# Patient Record
Sex: Female | Born: 1975 | Race: Black or African American | Hispanic: No | Marital: Single | Smoking: Current every day smoker
Health system: Southern US, Community
[De-identification: ages and names within clinical notes are randomized; demographics above are authoritative.]

## PROBLEM LIST (undated history)

## (undated) DIAGNOSIS — K2270 Barrett's esophagus without dysplasia: Secondary | ICD-10-CM

## (undated) DIAGNOSIS — E7800 Pure hypercholesterolemia, unspecified: Secondary | ICD-10-CM

## (undated) DIAGNOSIS — F32A Depression, unspecified: Secondary | ICD-10-CM

## (undated) DIAGNOSIS — R42 Dizziness and giddiness: Secondary | ICD-10-CM

## (undated) DIAGNOSIS — F329 Major depressive disorder, single episode, unspecified: Secondary | ICD-10-CM

## (undated) DIAGNOSIS — C801 Malignant (primary) neoplasm, unspecified: Secondary | ICD-10-CM

## (undated) HISTORY — PX: ABDOMINAL HYSTERECTOMY: SHX81

---

## 1999-09-15 ENCOUNTER — Ambulatory Visit (HOSPITAL_COMMUNITY): Admission: RE | Admit: 1999-09-15 | Discharge: 1999-09-15 | Payer: Self-pay | Admitting: *Deleted

## 1999-09-15 ENCOUNTER — Encounter: Payer: Self-pay | Admitting: *Deleted

## 2010-10-11 DIAGNOSIS — C801 Malignant (primary) neoplasm, unspecified: Secondary | ICD-10-CM

## 2010-10-11 HISTORY — DX: Malignant (primary) neoplasm, unspecified: C80.1

## 2011-05-06 ENCOUNTER — Inpatient Hospital Stay (INDEPENDENT_AMBULATORY_CARE_PROVIDER_SITE_OTHER)
Admission: RE | Admit: 2011-05-06 | Discharge: 2011-05-06 | Disposition: A | Discharge status: Home / Self Care | Payer: Medicaid Other | Source: Ambulatory Visit | Attending: Family Medicine | Admitting: Family Medicine

## 2011-05-06 ENCOUNTER — Encounter: Payer: Self-pay | Admitting: Family Medicine

## 2011-05-06 DIAGNOSIS — L03119 Cellulitis of unspecified part of limb: Secondary | ICD-10-CM

## 2011-05-08 ENCOUNTER — Telehealth (INDEPENDENT_AMBULATORY_CARE_PROVIDER_SITE_OTHER): Payer: Self-pay

## 2011-09-13 NOTE — Telephone Encounter (Signed)
  Phone Note Outgoing Call   Call placed by: Linton Flemings RN,  May 08, 2011 11:42 AM Call placed to: Patient Summary of Call: follow up call to pt. states she is doing better and is still taking ABT

## 2011-09-13 NOTE — Progress Notes (Signed)
Summary: BOIL ON INSIDE OF RIGHT LEG (room procedure)   Vital Signs:  Patient Profile:   35 Years Old Female CC:      erythema/edema site on inner right thigh x 2 days Height:     63 inches Weight:      139 pounds O2 Sat:      97 % O2 treatment:    Room Air Temp:     98.2 degrees F oral Pulse rate:   79 / minute Resp:     16 per minute BP sitting:   107 / 73  (left arm) Cuff size:   regular  Pt. in pain?   yes    Location:   inner right thigh  Vitals Entered By: Lavell Islam RN (May 06, 2011 1:06 PM)                   Prior Medication List:  No prior medications documented  Updated Prior Medication List: No Medications Current Allergies: No known allergies History of Present Illness Chief Complaint: erythema/edema site on inner right thigh x 2 days History of Present Illness:  Subjective:  Patient complains of two day history of erythematous tender area on right upper inner thigh.  The area is gradually expeanding.  No drainage from the lesion.  She feels well otherwise.  No fevers, chills, and sweats.  Denies insect or tick bite.  REVIEW OF SYSTEMS Constitutional Symptoms      Denies fever, chills, night sweats, weight loss, weight gain, and fatigue.  Eyes       Denies change in vision, eye pain, eye discharge, glasses, contact lenses, and eye surgery. Ear/Nose/Throat/Mouth       Denies hearing loss/aids, change in hearing, ear pain, ear discharge, dizziness, frequent runny nose, frequent nose bleeds, sinus problems, sore throat, hoarseness, and tooth pain or bleeding.  Respiratory       Denies dry cough, productive cough, wheezing, shortness of breath, asthma, bronchitis, and emphysema/COPD.  Cardiovascular       Denies murmurs, chest pain, and tires easily with exhertion.    Gastrointestinal       Denies stomach pain, nausea/vomiting, diarrhea, constipation, blood in bowel movements, and indigestion. Genitourniary       Denies painful urination, kidney  stones, and loss of urinary control. Neurological       Denies paralysis, seizures, and fainting/blackouts. Musculoskeletal       Complains of redness and swelling.      Denies muscle pain, joint pain, joint stiffness, decreased range of motion, muscle weakness, and gout.  Skin       Complains of unusual moles/lumps or sores.      Denies bruising and hair/skin or nail changes.  Psych       Denies mood changes, temper/anger issues, anxiety/stress, speech problems, depression, and sleep problems. Other Comments: site on inner right thigh x 2 days; no known etiology   Past History:  Family History: Last updated: 05/06/2011 Family History Hypertension Family History of Stroke  Social History: Last updated: 05/06/2011 Current Smoker Alcohol use-no  Past Medical History: Unremarkable  Past Surgical History: Tubal ligation  Family History: Family History Hypertension Family History of Stroke  Social History: Current Smoker Alcohol use-no Smoking Status:  current   Objective:  Appearance:  Patient appears healthy, stated age, and in no acute distress  Skin:  Right upper inner thigh:  approximately 4cm dia erythematous, tender, indurated area.  Not fluctuant. Assessment New Problems: CELLULITIS, LEG, RIGHT (ICD-682.6)  Plan New Medications/Changes: DOXYCYCLINE HYCLATE 100 MG CAPS (DOXYCYCLINE HYCLATE) One by mouth two times a day  #20 x 0, 05/06/2011, Donna Christen MD  New Orders: New Patient Level III [phone] Planning Comments:   Begin doxycycline.  May take Ibuprofen 200mg , 4 tabs every 8 hours with food.  Begin warm soaks and/or application of heating pad several times daily. Return if develops increasing pain/swelling (may need I and D), or if worsens.   The patient and/or caregiver has been counseled thoroughly with regard to medications prescribed including dosage, schedule, interactions, rationale for use, and possible side effects and they verbalize  understanding.  Diagnoses and expected course of recovery discussed and will return if not improved as expected or if the condition worsens. Patient and/or caregiver verbalized understanding.  Prescriptions: DOXYCYCLINE HYCLATE 100 MG CAPS (DOXYCYCLINE HYCLATE) One by mouth two times a day  #20 x 0   Entered and Authorized by:   Donna Christen MD   Signed by:   Donna Christen MD on 05/06/2011   Method used:   Print then Give to Patient   RxID:   [id number]   Orders Added: 1)  New Patient Level III [57846]

## 2012-06-19 ENCOUNTER — Emergency Department
Admission: EM | Admit: 2012-06-19 | Discharge: 2012-06-19 | Discharge status: Absent without leave | Payer: Medicaid Other | Source: Home / Self Care

## 2013-08-12 ENCOUNTER — Emergency Department (HOSPITAL_BASED_OUTPATIENT_CLINIC_OR_DEPARTMENT_OTHER)
Admission: EM | Admit: 2013-08-12 | Discharge: 2013-08-12 | Discharge status: Against Medical Advice | Payer: Medicaid Other | Attending: Emergency Medicine | Admitting: Emergency Medicine

## 2013-08-12 ENCOUNTER — Encounter (HOSPITAL_BASED_OUTPATIENT_CLINIC_OR_DEPARTMENT_OTHER): Payer: Self-pay | Admitting: Emergency Medicine

## 2013-08-12 DIAGNOSIS — F17200 Nicotine dependence, unspecified, uncomplicated: Secondary | ICD-10-CM | POA: Insufficient documentation

## 2013-08-12 DIAGNOSIS — R51 Headache: Secondary | ICD-10-CM | POA: Insufficient documentation

## 2013-08-12 HISTORY — DX: Major depressive disorder, single episode, unspecified: F32.9

## 2013-08-12 HISTORY — DX: Depression, unspecified: F32.A

## 2013-08-12 NOTE — ED Notes (Signed)
Pt called RN to room and states she had a family emergency and needs to leave. Encourage pt to stay. Pt was next to be seen. States she is unable to wait.

## 2013-08-12 NOTE — ED Notes (Addendum)
Pt having headache, and thirst for four days.  No known fever.  Some nausea.  Some sensitivity to light and sound.  Pt recently seen at [facility] on Friday for abdominal pain/headache.

## 2014-03-06 ENCOUNTER — Emergency Department (HOSPITAL_COMMUNITY)
Admission: EM | Admit: 2014-03-06 | Discharge: 2014-03-06 | Disposition: A | Discharge status: Home / Self Care | Payer: Medicaid Other | Attending: Emergency Medicine | Admitting: Emergency Medicine

## 2014-03-06 ENCOUNTER — Encounter (HOSPITAL_COMMUNITY): Payer: Self-pay | Admitting: Emergency Medicine

## 2014-03-06 DIAGNOSIS — M79609 Pain in unspecified limb: Secondary | ICD-10-CM | POA: Insufficient documentation

## 2014-03-06 DIAGNOSIS — Z79899 Other long term (current) drug therapy: Secondary | ICD-10-CM | POA: Insufficient documentation

## 2014-03-06 DIAGNOSIS — F329 Major depressive disorder, single episode, unspecified: Secondary | ICD-10-CM | POA: Insufficient documentation

## 2014-03-06 DIAGNOSIS — F3289 Other specified depressive episodes: Secondary | ICD-10-CM | POA: Insufficient documentation

## 2014-03-06 DIAGNOSIS — F17200 Nicotine dependence, unspecified, uncomplicated: Secondary | ICD-10-CM | POA: Insufficient documentation

## 2014-03-06 DIAGNOSIS — M79606 Pain in leg, unspecified: Secondary | ICD-10-CM

## 2014-03-06 LAB — BASIC METABOLIC PANEL
BUN: 9 mg/dL (ref 6–23)
CO2: 24 mEq/L (ref 19–32)
Calcium: 8.9 mg/dL (ref 8.4–10.5)
Chloride: 108 mEq/L (ref 96–112)
Creatinine, Ser: 0.77 mg/dL (ref 0.50–1.10)
GFR calc Af Amer: >90 mL/min (ref >90)
GFR calc non Af Amer: >90 mL/min (ref >90)
Glucose, Bld: 94 mg/dL (ref 70–99)
Potassium: 4.1 mEq/L (ref 3.7–5.3)
Sodium: 143 mEq/L (ref 137–147)

## 2014-03-06 MED ORDER — IBUPROFEN 800 MG PO TABS
800.0000 mg | ORAL_TABLET | Freq: Once | ORAL | Status: AC
  Administered 2014-03-06: 800 mg via ORAL
  Filled 2014-03-06: qty 1

## 2014-03-06 NOTE — Progress Notes (Signed)
Left lower extremity venous duplex completed.  Left:  No evidence of DVT, superficial thrombosis, or Baker's cyst.  Right:  Negative for DVT in the common femoral vein.  

## 2014-03-06 NOTE — ED Notes (Addendum)
Lt. Posterior thigh and calf pain.  Described as a toothache. Began 3 days ago..denies any injuries +ROM NO redness warmth or swelling noted to the areas.  Walking decrease the pain .  Pt. Is concerned about blood clot.

## 2014-03-06 NOTE — Discharge Instructions (Signed)
Musculoskeletal Pain °Musculoskeletal pain is muscle and boney aches and pains. These pains can occur in any part of the body. Your caregiver may treat you without knowing the cause of the pain. They may treat you if blood or urine tests, X-rays, and other tests were normal.  °CAUSES °There is often not a definite cause or reason for these pains. These pains may be caused by a type of germ (virus). The discomfort may also come from overuse. Overuse includes working out too hard when your body is not fit. Boney aches also come from weather changes. Bone is sensitive to atmospheric pressure changes. °HOME CARE INSTRUCTIONS  °· Ask when your test results will be ready. Make sure you get your test results. °· Only take over-the-counter or prescription medicines for pain, discomfort, or fever as directed by your caregiver. If you were given medications for your condition, do not drive, operate machinery or power tools, or sign legal documents for 24 hours. Do not drink alcohol. Do not take sleeping pills or other medications that may interfere with treatment. °· Continue all activities unless the activities cause more pain. When the pain lessens, slowly resume normal activities. Gradually increase the intensity and duration of the activities or exercise. °· During periods of severe pain, bed rest may be helpful. Lay or sit in any position that is comfortable. °· Putting ice on the injured area. °· Put ice in a bag. °· Place a towel between your skin and the bag. °· Leave the ice on for 15 to 20 minutes, 3 to 4 times a day. °· Follow up with your caregiver for continued problems and no reason can be found for the pain. If the pain becomes worse or does not go away, it may be necessary to repeat tests or do additional testing. Your caregiver may need to look further for a possible cause. °SEEK IMMEDIATE MEDICAL CARE IF: °· You have pain that is getting worse and is not relieved by medications. °· You develop chest pain  that is associated with shortness or breath, sweating, feeling sick to your stomach (nauseous), or throw up (vomit). °· Your pain becomes localized to the abdomen. °· You develop any new symptoms that seem different or that concern you. °MAKE SURE YOU:  °· Understand these instructions. °· Will watch your condition. °· Will get help right away if you are not doing well or get worse. °Document Released: 09/27/2005 Document Revised: 12/20/2011 Document Reviewed: 06/01/2013 °[employer]® Patient Information ©2014 [employer]. ° °

## 2014-03-06 NOTE — ED Provider Notes (Signed)
CSN: [id number]     Arrival date & time 03/06/14  4193 History   First MD Initiated Contact with Patient 03/06/14 0800     Chief Complaint  Patient presents with  . Leg Pain     (Consider location/radiation/quality/duration/timing/severity/associated sxs/prior Treatment) Patient is a 38 y.o. female presenting with leg pain. The history is provided by the patient.  Leg Pain Location:  Leg Time since incident:  3 days Injury: no   Leg location:  L lower leg Pain details:    Quality:  Aching   Radiates to:  Does not radiate   Severity:  Moderate   Onset quality:  Gradual   Duration:  3 days   Timing:  Constant   Progression:  Worsening Chronicity:  New Dislocation: no   Relieved by:  Movement Exacerbated by: rest. Associated symptoms: no fever     Past Medical History  Diagnosis Date  . Depression    Past Surgical History  Procedure Laterality Date  . Abdominal hysterectomy     No family history on file. History  Substance Use Topics  . Smoking status: Current Every Day Smoker    Types: Cigarettes  . Smokeless tobacco: Not on file  . Alcohol Use: Yes     Comment: occasional   OB History   Grav Para Term Preterm Abortions TAB SAB Ect Mult Living                 Review of Systems  Constitutional: Negative for fever.  Respiratory: Negative for cough and shortness of breath.   Gastrointestinal: Negative for vomiting and abdominal pain.  All other systems reviewed and are negative.     Allergies  Review of patient's allergies indicates no known allergies.  Home Medications   Prior to Admission medications   Medication Sig Start Date End Date Taking? Authorizing Provider  citalopram (CELEXA) 20 MG tablet Take 20 mg by mouth daily.    Historical Provider, MD  estradiol-norethindrone [facility]) 0.05-0.14 MG/DAY Place 1 patch onto the skin 2 (two) times a week.    Historical Provider, MD   BP 117/80  Pulse 76  Temp(Src) 98.1 F (36.7 C) (Oral)  Resp  18  Ht 5\' 3"  (1.6 m)  Wt 153 lb (69.4 kg)  BMI 27.11 kg/m2  SpO2 99% Physical Exam  Nursing note and vitals reviewed. Constitutional: She is oriented to person, place, and time. She appears well-developed and well-nourished. No distress.  HENT:  Head: Normocephalic and atraumatic.  Eyes: EOM are normal. Pupils are equal, round, and reactive to light.  Neck: Normal range of motion. Neck supple.  Cardiovascular: Normal rate and regular rhythm.  Exam reveals no friction rub.   No murmur heard. Pulmonary/Chest: Effort normal and breath sounds normal. No respiratory distress. She has no wheezes. She has no rales.  Abdominal: Soft. She exhibits no distension. There is no tenderness. There is no rebound.  Musculoskeletal: Normal range of motion. She exhibits no edema.       Left knee: She exhibits normal range of motion.       Left ankle: Normal.       Left lower leg: Normal. She exhibits no tenderness, no swelling, no edema and no deformity.  Neurological: She is alert and oriented to person, place, and time.  Skin: She is not diaphoretic.    ED Course  Procedures (including critical care time) Labs Review Labs Reviewed  BASIC METABOLIC PANEL    Imaging Review No results found.   EKG  Interpretation None      Filed: 03/06/2014 9:20 AM Note Time: 03/06/2014 9:20 AM Status: Signed   Editor: Charlaine Dalton, RVT (Cardiovascular Sonographer)      Left lower extremity venous duplex completed. Left: No evidence of DVT, superficial thrombosis, or Baker's cyst. Right: Negative for DVT in the common femoral vein.         MDM   Final diagnoses:  Leg pain    48F here with L leg pain. Pain for 3 days, aching type pain. Worse with rest, better with movement. No injury. No fevers, SOB, N/V/D. Normal strength and sensation. Patient concerned about blood clot. Has risk factors of smoking, estrogen use, will [place] her leg. No swelling, normal pulses, normal ROM. DVT scan normal. Likely  musculoskeletal pain. Stable for discharge.  Osvaldo Shipper, MD 03/06/14 1020

## 2016-11-17 ENCOUNTER — Encounter (HOSPITAL_BASED_OUTPATIENT_CLINIC_OR_DEPARTMENT_OTHER): Payer: Self-pay

## 2016-11-17 ENCOUNTER — Emergency Department (HOSPITAL_BASED_OUTPATIENT_CLINIC_OR_DEPARTMENT_OTHER)
Admission: EM | Admit: 2016-11-17 | Discharge: 2016-11-17 | Disposition: A | Discharge status: Home / Self Care | Payer: BLUE CROSS/BLUE SHIELD | Attending: Emergency Medicine | Admitting: Emergency Medicine

## 2016-11-17 ENCOUNTER — Emergency Department (HOSPITAL_BASED_OUTPATIENT_CLINIC_OR_DEPARTMENT_OTHER): Payer: BLUE CROSS/BLUE SHIELD

## 2016-11-17 DIAGNOSIS — J111 Influenza due to unidentified influenza virus with other respiratory manifestations: Secondary | ICD-10-CM

## 2016-11-17 DIAGNOSIS — R197 Diarrhea, unspecified: Secondary | ICD-10-CM | POA: Insufficient documentation

## 2016-11-17 DIAGNOSIS — R05 Cough: Secondary | ICD-10-CM | POA: Diagnosis present

## 2016-11-17 DIAGNOSIS — R1110 Vomiting, unspecified: Secondary | ICD-10-CM | POA: Diagnosis not present

## 2016-11-17 DIAGNOSIS — R69 Illness, unspecified: Secondary | ICD-10-CM

## 2016-11-17 DIAGNOSIS — M791 Myalgia: Secondary | ICD-10-CM | POA: Insufficient documentation

## 2016-11-17 DIAGNOSIS — F17210 Nicotine dependence, cigarettes, uncomplicated: Secondary | ICD-10-CM | POA: Diagnosis not present

## 2016-11-17 MED ORDER — OSELTAMIVIR PHOSPHATE 75 MG PO CAPS: 75.0000 mg | ORAL_CAPSULE | Freq: Two times a day (BID) | ORAL | 0 refills | Status: DC

## 2016-11-17 MED ORDER — ALBUTEROL SULFATE HFA 108 (90 BASE) MCG/ACT IN AERS
4.0000 | INHALATION_SPRAY | Freq: Once | RESPIRATORY_TRACT | Status: AC
  Administered 2016-11-17: 4 via RESPIRATORY_TRACT
  Filled 2016-11-17: qty 6.7

## 2016-11-17 MED ORDER — ONDANSETRON 4 MG PO TBDP
4.0000 mg | ORAL_TABLET | Freq: Once | ORAL | Status: AC
  Administered 2016-11-17: 4 mg via ORAL
  Filled 2016-11-17: qty 1

## 2016-11-17 NOTE — ED Triage Notes (Addendum)
Cough and URI symptoms x2 days, diarrhea with mild N/V that started today.

## 2016-11-17 NOTE — Discharge Instructions (Signed)
You likely have the flu. This will take about 1-2 weeks to fully resolve.   Keep well hydrated and get rest.  Return for worsening symptoms, including persistent fever greater than 6-7 days, difficulty breathing, passing out, or any other symptoms concerning to you.

## 2016-11-17 NOTE — ED Provider Notes (Signed)
[place] DEPT [facility] Provider Note   CSN: [id number] Arrival date & time: 11/17/16  [id number]     History   Chief Complaint Chief Complaint  Patient presents with  . URI    HPI Jillian Kramer is a 41 y.o. female.  HPI 41 year old female who presents with cough and diarrhea. Onset of minimally productive cough 2 days ago with congestion. Had diarrhea and small episode of nausea and vomiting that started this morning. Initially felt a little lightheaded with this, but no syncope. Has tried over-the-counter cough medications without improvement in symptoms. No significant fevers or chills, abdominal pain, dysuria, urinary frequency. Has had soreness and body aches with this.   Past Medical History:  Diagnosis Date  . Depression     There are no active problems to display for this patient.   Past Surgical History:  Procedure Laterality Date  . ABDOMINAL HYSTERECTOMY      OB History    No data available       Home Medications    Prior to Admission medications   Medication Sig Start Date End Date Taking? Authorizing Provider  diclofenac (VOLTAREN) 75 MG EC tablet Take 75 mg by mouth 2 (two) times daily as needed (leg pain).    Historical Provider, MD  estradiol-norethindrone [facility]) 0.05-0.14 MG/DAY Place 1 patch onto the skin 2 (two) times a week. Wednesday & Saturday.    Historical Provider, MD    Family History No family history on file.  Social History Social History  Substance Use Topics  . Smoking status: Current Every Day Smoker    Packs/day: 0.50    Types: Cigarettes  . Smokeless tobacco: Never Used  . Alcohol use Yes     Comment: occasional     Allergies   Patient has no known allergies.   Review of Systems Review of Systems  Constitutional: Negative for fever.  HENT: Positive for congestion. Negative for sore throat.   Respiratory: Positive for cough.   Gastrointestinal: Positive for diarrhea and vomiting. Negative for abdominal pain.    Genitourinary: Negative for difficulty urinating.  Musculoskeletal: Positive for myalgias.  Allergic/Immunologic: Negative for immunocompromised state.  Hematological: Does not bruise/bleed easily.  All other systems reviewed and are negative.    Physical Exam Updated Vital Signs BP 127/92 (BP Location: Left Arm)   Pulse 103   Temp 98.4 F (36.9 C) (Oral)   Resp 18   Ht 5\' 3"  (1.6 m)   Wt 182 lb (82.6 kg)   SpO2 96%   BMI 32.24 kg/m   Physical Exam Physical Exam  Nursing note and vitals reviewed. Constitutional: Well developed, well nourished, non-toxic, and in no acute distress Head: Normocephalic and atraumatic.  Mouth/Throat: Oropharynx is clear and moist.  Neck: Normal range of motion. Neck supple.  Cardiovascular: Normal rate and regular rhythm.   Pulmonary/Chest: Effort normal and breath sounds normal.  scattered wheezing Abdominal: Soft. There is no tenderness. There is no rebound and no guarding.  Musculoskeletal: Normal range of motion.  Neurological: Alert, no facial droop, fluent speech, moves all extremities symmetrically Skin: Skin is warm and dry.  Psychiatric: Cooperative   ED Treatments / Results  Labs (all labs ordered are listed, but only abnormal results are displayed) Labs Reviewed - No data to display  EKG  EKG Interpretation None       Radiology Dg Chest 2 View  Result Date: 11/17/2016 CLINICAL DATA:  Cough. EXAM: CHEST  2 VIEW COMPARISON:  05/21/2015 FINDINGS: The heart size and  mediastinal contours are within normal limits. There is slight peribronchial thickening on the lateral view. Slight linear scarring in the left lung base laterally. The visualized skeletal structures are unremarkable. IMPRESSION: Slight bronchitic changes. Electronically Signed   By: Lorriane Shire M.D.   On: 11/17/2016 09:42    Procedures Procedures (including critical care time)  Medications Ordered in ED Medications  ondansetron (ZOFRAN-ODT) disintegrating  tablet 4 mg (4 mg Oral Given 11/17/16 0943)  albuterol (PROVENTIL HFA;VENTOLIN HFA) 108 (90 Base) MCG/ACT inhaler 4 puff (4 puffs Inhalation Given 11/17/16 0949)     Initial Impression / Assessment and Plan / ED Course  I have reviewed the triage vital signs and the nursing notes.  Pertinent labs & imaging results that were available during my care of the patient were reviewed by me and considered in my medical decision making (see chart for details).     41 year old female who presents with 2 days of cough, diarrhea. On presentation is well appearing and in no acute distress. Well hydrated on exam. Lungs with occasional wheeze, and no prior history of asthma or COPD. Current smoker. Given albuterol inhaler. CXR visualized and w/o pneumonia or infiltrate. On presentation consistent with viral illness. Discussed supportive care management for this. Discussed Tamiflu for potential flu illness. She has declined this medication at this time.   The patient appears reasonably screened and/or stabilized for discharge and I doubt any other medical condition or other [facility] requiring further screening, evaluation, or treatment in the ED at this time prior to discharge.  Strict return and follow-up instructions reviewed. She expressed understanding of all discharge instructions and felt comfortable with the plan of care.   Final Clinical Impressions(s) / ED Diagnoses   Final diagnoses:  Influenza-like illness    New Prescriptions Current Discharge Medication List       Forde Dandy, MD 11/17/16 [id number]

## 2016-11-23 ENCOUNTER — Emergency Department (HOSPITAL_BASED_OUTPATIENT_CLINIC_OR_DEPARTMENT_OTHER)
Admission: EM | Admit: 2016-11-23 | Discharge: 2016-11-23 | Disposition: A | Discharge status: Home / Self Care | Payer: BLUE CROSS/BLUE SHIELD | Attending: Emergency Medicine | Admitting: Emergency Medicine

## 2016-11-23 ENCOUNTER — Emergency Department (HOSPITAL_BASED_OUTPATIENT_CLINIC_OR_DEPARTMENT_OTHER): Payer: BLUE CROSS/BLUE SHIELD

## 2016-11-23 ENCOUNTER — Encounter (HOSPITAL_BASED_OUTPATIENT_CLINIC_OR_DEPARTMENT_OTHER): Payer: Self-pay | Admitting: *Deleted

## 2016-11-23 DIAGNOSIS — J40 Bronchitis, not specified as acute or chronic: Secondary | ICD-10-CM | POA: Diagnosis not present

## 2016-11-23 DIAGNOSIS — F17210 Nicotine dependence, cigarettes, uncomplicated: Secondary | ICD-10-CM | POA: Diagnosis not present

## 2016-11-23 DIAGNOSIS — R05 Cough: Secondary | ICD-10-CM | POA: Diagnosis present

## 2016-11-23 DIAGNOSIS — Z79899 Other long term (current) drug therapy: Secondary | ICD-10-CM | POA: Diagnosis not present

## 2016-11-23 MED ORDER — AZITHROMYCIN 250 MG PO TABS: 250.0000 mg | ORAL_TABLET | Freq: Every day | ORAL | 0 refills | Status: DC

## 2016-11-23 MED ORDER — BENZONATATE 100 MG PO CAPS: 100.0000 mg | ORAL_CAPSULE | Freq: Two times a day (BID) | ORAL | 0 refills | Status: DC | PRN

## 2016-11-23 MED ORDER — GUAIFENESIN-DM 100-10 MG/5ML PO SYRP: 5.0000 mL | ORAL_SOLUTION | ORAL | 0 refills | Status: DC | PRN

## 2016-11-23 NOTE — ED Triage Notes (Signed)
Continued cough. States she was seen for possible flu 5 days ago.

## 2016-11-23 NOTE — ED Provider Notes (Signed)
[place] DEPT [facility] Provider Note   CSN: [id number] Arrival date & time: 11/23/16  1341     History   Chief Complaint Chief Complaint  Patient presents with  . Cough    HPI Jillian Kramer is a 41 y.o. female.  HPI   Pt p/w 8 days of flu-like illness with has been improving but with persistent cough, today productive of bloody sputum.  Overall she has gotten much better and the chills, myalgias, diarrhea have resolved.  She feels that she is having trouble moving the congestion out of her chest.  Continues to have nasal discharge.  No recent fevers.  Denies SOB, CP, sore throat.    Denies recent immobilization, exogenous estrogen, leg swelling, history of blood clots.     Past Medical History:  Diagnosis Date  . Depression     There are no active problems to display for this patient.   Past Surgical History:  Procedure Laterality Date  . ABDOMINAL HYSTERECTOMY      OB History    No data available       Home Medications    Prior to Admission medications   Medication Sig Start Date End Date Taking? Authorizing Provider  azithromycin (ZITHROMAX) 250 MG tablet Take 1 tablet (250 mg total) by mouth daily. Take first 2 tablets together, then 1 every day until finished. 11/23/16   Clayton Bibles, PA-C  benzonatate (TESSALON) 100 MG capsule Take 1 capsule (100 mg total) by mouth 2 (two) times daily as needed for cough. 11/23/16   Clayton Bibles, PA-C  diclofenac (VOLTAREN) 75 MG EC tablet Take 75 mg by mouth 2 (two) times daily as needed (leg pain).    Historical Provider, MD  estradiol-norethindrone [facility]) 0.05-0.14 MG/DAY Place 1 patch onto the skin 2 (two) times a week. Wednesday & Saturday.    Historical Provider, MD  guaiFENesin-dextromethorphan (ROBITUSSIN DM) 100-10 MG/5ML syrup Take 5 mLs by mouth every 4 (four) hours as needed for cough. 11/23/16   Clayton Bibles, PA-C    Family History No family history on file.  Social History Social History  Substance Use  Topics  . Smoking status: Current Every Day Smoker    Packs/day: 0.50    Types: Cigarettes  . Smokeless tobacco: Never Used  . Alcohol use Yes     Comment: occasional     Allergies   Patient has no known allergies.   Review of Systems Review of Systems  All other systems reviewed and are negative.    Physical Exam Updated Vital Signs BP 133/88   Pulse 70   Temp 98 F (36.7 C) (Oral)   Resp 20   Ht 5\' 3"  (1.6 m)   Wt 82.6 kg   SpO2 100%   BMI 32.24 kg/m   Physical Exam  Constitutional: She appears well-developed and well-nourished. No distress.  HENT:  Head: Normocephalic and atraumatic.  Mouth/Throat: Uvula is midline, oropharynx is clear and moist and mucous membranes are normal. No oropharyngeal exudate, posterior oropharyngeal edema, posterior oropharyngeal erythema or tonsillar abscesses.  Eyes: Conjunctivae are normal.  Neck: Normal range of motion. Neck supple.  Cardiovascular: Normal rate and regular rhythm.   Pulmonary/Chest: Effort normal and breath sounds normal. No stridor. No respiratory distress. She has no wheezes. She has no rales.  Abdominal: Soft. She exhibits no distension. There is no tenderness.  Lymphadenopathy:    She has no cervical adenopathy.  Neurological: She is alert.  Skin: She is not diaphoretic.  Nursing note and vitals  reviewed.    ED Treatments / Results  Labs (all labs ordered are listed, but only abnormal results are displayed) Labs Reviewed - No data to display  EKG  EKG Interpretation None       Radiology Dg Chest 2 View  Result Date: 11/23/2016 CLINICAL DATA:  Cough and congestion with intermittent fever for 8 days. EXAM: CHEST  2 VIEW COMPARISON:  PA and lateral chest 11/17/2016. Single-view of the chest 05/21/2015. FINDINGS: Small linear focus of atelectasis or scar is seen in the lingula. Lungs are otherwise clear. Heart size is upper normal. No pneumothorax or pleural fluid. No acute bony abnormality.  IMPRESSION: No acute disease. Electronically Signed   By: Inge Rise M.D.   On: 11/23/2016 15:24    Procedures Procedures (including critical care time)  Medications Ordered in ED Medications - No data to display   Initial Impression / Assessment and Plan / ED Course  I have reviewed the triage vital signs and the nursing notes.  Pertinent labs & imaging results that were available during my care of the patient were reviewed by me and considered in my medical decision making (see chart for details).    Counseled on smoking cessation > 5 minutes.   Afebrile nontoxic patient with 8 days of influenza-like illness with resolving symptoms with exception of cough with hemoptysis today.  Reviewed CXR and discussed treatment with Dr Laverta Baltimore.  Pt has no PE risk factors.  D/C home with z-pak, cough medications, PCP follow up.  Discussed result, findings, treatment, and follow up  with patient.  Pt given return precautions.  Pt verbalizes understanding and agrees with plan.      Final Clinical Impressions(s) / ED Diagnoses   Final diagnoses:  Bronchitis    New Prescriptions Discharge Medication List as of 11/23/2016  4:20 PM    START taking these medications   Details  azithromycin (ZITHROMAX) 250 MG tablet Take 1 tablet (250 mg total) by mouth daily. Take first 2 tablets together, then 1 every day until finished., Starting Tue 11/23/2016, Print    benzonatate (TESSALON) 100 MG capsule Take 1 capsule (100 mg total) by mouth 2 (two) times daily as needed for cough., Starting Tue 11/23/2016, Print    guaiFENesin-dextromethorphan (ROBITUSSIN DM) 100-10 MG/5ML syrup Take 5 mLs by mouth every 4 (four) hours as needed for cough., Starting Tue 11/23/2016, Print         [place], PA-C 11/23/16 1635    Margette Fast, MD 11/24/16 1153

## 2016-11-23 NOTE — Discharge Instructions (Signed)
Read the information below.  Use the prescribed medication as directed.  Please discuss all new medications with your pharmacist.  You may return to the Emergency Department at any time for worsening condition or any new symptoms that concern you.    If you develop chest pain, shortness of breath, fever, you pass out, or become weak or dizzy, return to the ER for a recheck.    °

## 2017-08-02 ENCOUNTER — Emergency Department (HOSPITAL_BASED_OUTPATIENT_CLINIC_OR_DEPARTMENT_OTHER)
Admission: EM | Admit: 2017-08-02 | Discharge: 2017-08-02 | Disposition: A | Discharge status: Home / Self Care | Payer: BLUE CROSS/BLUE SHIELD | Attending: Emergency Medicine | Admitting: Emergency Medicine

## 2017-08-02 DIAGNOSIS — F17210 Nicotine dependence, cigarettes, uncomplicated: Secondary | ICD-10-CM | POA: Diagnosis not present

## 2017-08-02 DIAGNOSIS — K2100 Gastro-esophageal reflux disease with esophagitis, without bleeding: Secondary | ICD-10-CM

## 2017-08-02 DIAGNOSIS — R070 Pain in throat: Secondary | ICD-10-CM | POA: Diagnosis present

## 2017-08-02 DIAGNOSIS — R1010 Upper abdominal pain, unspecified: Secondary | ICD-10-CM | POA: Diagnosis not present

## 2017-08-02 LAB — CBC WITH DIFFERENTIAL/PLATELET
BASOS PCT: 0 %
Basophils Absolute: 0 10*3/uL (ref 0.0–0.1)
EOS ABS: 0.2 10*3/uL (ref 0.0–0.7)
EOS PCT: 3 %
HCT: 34.9 % — ABNORMAL LOW (ref 36.0–46.0)
HEMOGLOBIN: 12.3 g/dL (ref 12.0–15.0)
Lymphocytes Relative: 47 %
Lymphs Abs: 3.5 10*3/uL (ref 0.7–4.0)
MCH: 24.8 pg — AB (ref 26.0–34.0)
MCHC: 35.2 g/dL (ref 30.0–36.0)
MCV: 70.4 fL — AB (ref 78.0–100.0)
MONO ABS: 0.6 10*3/uL (ref 0.1–1.0)
Monocytes Relative: 8 %
NEUTROS ABS: 3.2 10*3/uL (ref 1.7–7.7)
Neutrophils Relative %: 42 %
PLATELETS: 275 10*3/uL (ref 150–400)
RBC: 4.96 MIL/uL (ref 3.87–5.11)
RDW: 14.9 % (ref 11.5–15.5)
WBC: 7.5 10*3/uL (ref 4.0–10.5)

## 2017-08-02 LAB — COMPREHENSIVE METABOLIC PANEL
ALT: 21 U/L (ref 14–54)
ANION GAP: 7 (ref 5–15)
AST: 18 U/L (ref 15–41)
Albumin: 3.8 g/dL (ref 3.5–5.0)
Alkaline Phosphatase: 67 U/L (ref 38–126)
BUN: 19 mg/dL (ref 6–20)
CALCIUM: 9.4 mg/dL (ref 8.9–10.3)
CO2: 24 mmol/L (ref 22–32)
CREATININE: 0.72 mg/dL (ref 0.44–1.00)
Chloride: 106 mmol/L (ref 101–111)
Glucose, Bld: 102 mg/dL — ABNORMAL HIGH (ref 65–99)
Potassium: 3.8 mmol/L (ref 3.5–5.1)
SODIUM: 137 mmol/L (ref 135–145)
Total Bilirubin: 0.4 mg/dL (ref 0.3–1.2)
Total Protein: 7.4 g/dL (ref 6.5–8.1)

## 2017-08-02 LAB — LIPASE, BLOOD: LIPASE: 25 U/L (ref 11–51)

## 2017-08-02 LAB — TROPONIN I

## 2017-08-02 MED ORDER — ALUM & MAG HYDROXIDE-SIMETH 400-400-40 MG/5ML PO SUSP: 15.0000 mL | Freq: Four times a day (QID) | ORAL | 0 refills | Status: DC | PRN

## 2017-08-02 MED ORDER — OMEPRAZOLE 20 MG PO CPDR: 20.0000 mg | DELAYED_RELEASE_CAPSULE | Freq: Two times a day (BID) | ORAL | 2 refills | Status: DC

## 2017-08-02 MED ORDER — GI COCKTAIL ~~LOC~~
30.0000 mL | Freq: Once | ORAL | Status: AC
  Administered 2017-08-02: 30 mL via ORAL
  Filled 2017-08-02: qty 30

## 2017-08-02 NOTE — ED Notes (Signed)
Pt. Reports the wings were really hot that she ate

## 2017-08-02 NOTE — Discharge Instructions (Signed)
Avoid alcohol, NSAIDs (ibuprofen, motrin, naprosyn), fatty or spicy foods Take medications as prescribed. One is acid inhibitor that may take a few weeks to kick in Return for worsening symptoms, including fever, intractable vomiting, escalating pain or any other symptoms concerning to you.

## 2017-08-02 NOTE — ED Triage Notes (Signed)
Reflux x 3 days. States she has been taking OTC reflux medication. Pain is worse when she swallows.

## 2017-08-02 NOTE — ED Provider Notes (Signed)
[facility] EMERGENCY DEPARTMENT Provider Note   CSN: [id number] Arrival date & time: 08/02/17  2007     History   Chief Complaint No chief complaint on file.   HPI Jillian Kramer is a 41 y.o. female.  HPI 41 year old female who presents with concern for reflux. State she ate hot wings 3 days ago, and since then with burning sensation in back of throat. Associated with upper abdominal bloating and pain. Try taking Pepto-Bismol and Tums without improvement. Denies any nausea, vomiting, diarrhea, melena or hematochezia, severe chest pain or difficulty breathing, syncope or near syncope, diaphoresis,melena or hematochezia. She has not had similar symptoms in the past.  Past Medical History:  Diagnosis Date  . Depression     There are no active problems to display for this patient.   Past Surgical History:  Procedure Laterality Date  . ABDOMINAL HYSTERECTOMY      OB History    No data available       Home Medications    Prior to Admission medications   Medication Sig Start Date End Date Taking? Authorizing Provider  alum & mag hydroxide-simeth (MYLANTA DOUBLE-STRENGTH) 400-400-40 MG/5ML suspension Take 15 mLs by mouth every 6 (six) hours as needed for indigestion (reflux). 08/02/17   Forde Dandy, MD  azithromycin (ZITHROMAX) 250 MG tablet Take 1 tablet (250 mg total) by mouth daily. Take first 2 tablets together, then 1 every day until finished. 11/23/16   Clayton Bibles, PA-C  benzonatate (TESSALON) 100 MG capsule Take 1 capsule (100 mg total) by mouth 2 (two) times daily as needed for cough. 11/23/16   Clayton Bibles, PA-C  diclofenac (VOLTAREN) 75 MG EC tablet Take 75 mg by mouth 2 (two) times daily as needed (leg pain).    [provider]  estradiol-norethindrone [facility]) 0.05-0.14 MG/DAY Place 1 patch onto the skin 2 (two) times a week. Wednesday & Saturday.    [provider]  guaiFENesin-dextromethorphan (ROBITUSSIN DM) 100-10 MG/5ML  syrup Take 5 mLs by mouth every 4 (four) hours as needed for cough. 11/23/16   Clayton Bibles, PA-C  omeprazole (PRILOSEC) 20 MG capsule Take 1 capsule (20 mg total) by mouth 2 (two) times daily before a meal. 08/02/17   Forde Dandy, MD    Family History No family history on file.  Social History Social History  Substance Use Topics  . Smoking status: Current Every Day Smoker    Packs/day: 0.50    Types: Cigarettes  . Smokeless tobacco: Never Used  . Alcohol use Yes     Comment: occasional     Allergies   Patient has no known allergies.   Review of Systems Review of Systems  Respiratory: Negative for shortness of breath.   Cardiovascular: Negative for chest pain.  Gastrointestinal: Positive for abdominal pain.  All other systems reviewed and are negative.    Physical Exam Updated Vital Signs BP 113/82 (BP Location: Right Arm)   Pulse 72   Temp 98.1 F (36.7 C) (Oral)   Resp 18   Ht 5\' 3"  (1.6 m)   Wt 77.1 kg (170 lb)   SpO2 97%   BMI 30.11 kg/m   Physical Exam Physical Exam  Nursing note and vitals reviewed. Constitutional: Well developed, well nourished, non-toxic, and in no acute distress Head: Normocephalic and atraumatic.  Mouth/Throat: Oropharynx is clear and moist.  Neck: Normal range of motion. Neck supple.  Cardiovascular: Normal rate and regular rhythm.   Pulmonary/Chest: Effort normal and breath  sounds normal.  Abdominal: Soft. There is Mild epigastric tenderness. There is no rebound and no guarding.  Musculoskeletal: Normal range of motion.  Neurological: Alert, no facial droop, fluent speech, moves all extremities symmetrically Skin: Skin is warm and dry.  Psychiatric: Cooperative   ED Treatments / Results  Labs (all labs ordered are listed, but only abnormal results are displayed) Labs Reviewed  CBC WITH DIFFERENTIAL/PLATELET - Abnormal; Notable for the following:       Result Value   HCT 34.9 (*)    MCV 70.4 (*)    MCH 24.8 (*)    All  other components within normal limits  COMPREHENSIVE METABOLIC PANEL - Abnormal; Notable for the following:    Glucose, Bld 102 (*)    All other components within normal limits  LIPASE, BLOOD  TROPONIN I    EKG  EKG Interpretation  Date/Time:  Tuesday August 02 2017 20:19:06 EDT Ventricular Rate:  73 PR Interval:  156 QRS Duration: 90 QT Interval:  368 QTC Calculation: 405 R Axis:   63 Text Interpretation:  Normal sinus rhythm Normal ECG No acute changes Confirmed by Brantley Stage [phone]) on 08/02/2017 10:27:00 PM       Radiology No results found.  Procedures Procedures (including critical care time)  Medications Ordered in ED Medications  gi cocktail (Maalox,Lidocaine,Donnatal) (30 mLs Oral Given 08/02/17 2146)     Initial Impression / Assessment and Plan / ED Course  I have reviewed the triage vital signs and the nursing notes.  Pertinent labs & imaging results that were available during my care of the patient were reviewed by me and considered in my medical decision making (see chart for details).     Presenting with 3 days of upper abdominal distention, pain, and burning in the back of her throat. Presentation seems consistent with reflux and indigestion. Her vital signs are stable. She is well-appearing. Abdomen is overall soft and benign with minimal epigastric tenderness. Symptoms not concerning for atypical ACS presentation.Blood work including CBC, comprehensive metabolic panel, lipase are unremarkable. Given GI cocktail with improved symptoms. Will discharge with PPI and Mylanta. Strict return and follow-up instructions reviewed. She expressed understanding of all discharge instructions and felt comfortable with the plan of care.   Final Clinical Impressions(s) / ED Diagnoses   Final diagnoses:  Gastroesophageal reflux disease with esophagitis    New Prescriptions New Prescriptions   ALUM & MAG HYDROXIDE-SIMETH (MYLANTA DOUBLE-STRENGTH) 400-400-40 MG/5ML  SUSPENSION    Take 15 mLs by mouth every 6 (six) hours as needed for indigestion (reflux).   OMEPRAZOLE (PRILOSEC) 20 MG CAPSULE    Take 1 capsule (20 mg total) by mouth 2 (two) times daily before a meal.     Forde Dandy, MD 08/02/17 2242

## 2017-11-04 ENCOUNTER — Encounter (HOSPITAL_BASED_OUTPATIENT_CLINIC_OR_DEPARTMENT_OTHER): Payer: Self-pay

## 2017-11-04 ENCOUNTER — Emergency Department (HOSPITAL_BASED_OUTPATIENT_CLINIC_OR_DEPARTMENT_OTHER)
Admission: EM | Admit: 2017-11-04 | Discharge: 2017-11-04 | Disposition: A | Discharge status: Home / Self Care | Payer: BLUE CROSS/BLUE SHIELD | Attending: Emergency Medicine | Admitting: Emergency Medicine

## 2017-11-04 ENCOUNTER — Other Ambulatory Visit: Payer: Self-pay

## 2017-11-04 DIAGNOSIS — F329 Major depressive disorder, single episode, unspecified: Secondary | ICD-10-CM | POA: Insufficient documentation

## 2017-11-04 DIAGNOSIS — Z8541 Personal history of malignant neoplasm of cervix uteri: Secondary | ICD-10-CM | POA: Insufficient documentation

## 2017-11-04 DIAGNOSIS — Z79899 Other long term (current) drug therapy: Secondary | ICD-10-CM | POA: Insufficient documentation

## 2017-11-04 DIAGNOSIS — F17210 Nicotine dependence, cigarettes, uncomplicated: Secondary | ICD-10-CM | POA: Insufficient documentation

## 2017-11-04 DIAGNOSIS — R1013 Epigastric pain: Secondary | ICD-10-CM | POA: Insufficient documentation

## 2017-11-04 HISTORY — DX: Malignant (primary) neoplasm, unspecified: C80.1

## 2017-11-04 LAB — URINALYSIS, ROUTINE W REFLEX MICROSCOPIC
BILIRUBIN URINE: NEGATIVE
Glucose, UA: NEGATIVE mg/dL
Ketones, ur: NEGATIVE mg/dL
Leukocytes, UA: NEGATIVE
Nitrite: NEGATIVE
PROTEIN: NEGATIVE mg/dL
Specific Gravity, Urine: 1.025 (ref 1.005–1.030)
pH: 6 (ref 5.0–8.0)

## 2017-11-04 LAB — CBC
HCT: 33.6 % — ABNORMAL LOW (ref 36.0–46.0)
Hemoglobin: 12.1 g/dL (ref 12.0–15.0)
MCH: 25.3 pg — AB (ref 26.0–34.0)
MCHC: 36 g/dL (ref 30.0–36.0)
MCV: 70.1 fL — ABNORMAL LOW (ref 78.0–100.0)
PLATELETS: 299 10*3/uL (ref 150–400)
RBC: 4.79 MIL/uL (ref 3.87–5.11)
RDW: 14.5 % (ref 11.5–15.5)
WBC: 7.8 10*3/uL (ref 4.0–10.5)

## 2017-11-04 LAB — COMPREHENSIVE METABOLIC PANEL
ALK PHOS: 68 U/L (ref 38–126)
ALT: 23 U/L (ref 14–54)
AST: 20 U/L (ref 15–41)
Albumin: 3.8 g/dL (ref 3.5–5.0)
Anion gap: 7 (ref 5–15)
BILIRUBIN TOTAL: 0.4 mg/dL (ref 0.3–1.2)
BUN: 12 mg/dL (ref 6–20)
CALCIUM: 9.1 mg/dL (ref 8.9–10.3)
CO2: 25 mmol/L (ref 22–32)
CREATININE: 0.86 mg/dL (ref 0.44–1.00)
Chloride: 107 mmol/L (ref 101–111)
Glucose, Bld: 95 mg/dL (ref 65–99)
Potassium: 3.6 mmol/L (ref 3.5–5.1)
SODIUM: 139 mmol/L (ref 135–145)
Total Protein: 7.9 g/dL (ref 6.5–8.1)

## 2017-11-04 LAB — URINALYSIS, MICROSCOPIC (REFLEX): WBC, UA: NONE SEEN WBC/hpf (ref 0–5)

## 2017-11-04 LAB — LIPASE, BLOOD: Lipase: 27 U/L (ref 11–51)

## 2017-11-04 LAB — PREGNANCY, URINE: PREG TEST UR: NEGATIVE

## 2017-11-04 MED ORDER — GI COCKTAIL ~~LOC~~
30.0000 mL | Freq: Once | ORAL | Status: AC
  Administered 2017-11-04: 30 mL via ORAL
  Filled 2017-11-04: qty 30

## 2017-11-04 MED ORDER — FAMOTIDINE 20 MG PO TABS: 20.0000 mg | ORAL_TABLET | Freq: Two times a day (BID) | ORAL | 0 refills | Status: AC

## 2017-11-04 NOTE — Discharge Instructions (Signed)
Please take Pepcid twice daily for the next week Follow up with your doctor Return if worsening

## 2017-11-04 NOTE — ED Triage Notes (Signed)
Pt c/o dull burning epigastric pain x 1 week. Pt also feels it in the back. Pt endorses associated nausea. Denies CP, ShOB.

## 2017-11-04 NOTE — ED Notes (Signed)
Attempted to call pt for bloodwork, pt went outside of ED

## 2017-11-04 NOTE — ED Provider Notes (Signed)
[facility] EMERGENCY DEPARTMENT Provider Note   CSN: [id number] Arrival date & time: 11/04/17  1440     History   Chief Complaint Chief Complaint  Patient presents with  . Abdominal Pain    HPI Jillian Kramer is a 42 y.o. female who presents with epigastric abdominal pain. PMH significant for GERD, hx of cervical cancer. She states that she's had epigastric pain for the past week. It comes and goes. It is a "nagging" pain. It felt similar to heartburn but also different. She has been taking Ibuprofen for MSK pain because she went to [facility] on 1/17 for nausea, chest pain, and left shoulder pain and testing came back normal. She thinks the Ibuprofen is aggravating her symptoms. Nothing has made her symptoms better. She has mild nausea as well. She has not tried anything OTC. She denies fever, chills, chest pain, SOB, vomiting, diarrhea. She was seen in the ED for heartburn in October as well.  HPI  Past Medical History:  Diagnosis Date  . Cancer ([place]) 2012   cervical stage 0  . Depression     There are no active problems to display for this patient.   Past Surgical History:  Procedure Laterality Date  . ABDOMINAL HYSTERECTOMY      OB History    No data available       Home Medications    Prior to Admission medications   Medication Sig Start Date End Date Taking? Authorizing Provider  alum & mag hydroxide-simeth (MYLANTA DOUBLE-STRENGTH) 400-400-40 MG/5ML suspension Take 15 mLs by mouth every 6 (six) hours as needed for indigestion (reflux). 08/02/17   Forde Dandy, MD  azithromycin (ZITHROMAX) 250 MG tablet Take 1 tablet (250 mg total) by mouth daily. Take first 2 tablets together, then 1 every day until finished. 11/23/16   Clayton Bibles, PA-C  benzonatate (TESSALON) 100 MG capsule Take 1 capsule (100 mg total) by mouth 2 (two) times daily as needed for cough. 11/23/16   Clayton Bibles, PA-C  diclofenac (VOLTAREN) 75 MG EC tablet Take 75 mg by mouth 2 (two)  times daily as needed (leg pain).    [provider]  estradiol-norethindrone [facility]) 0.05-0.14 MG/DAY Place 1 patch onto the skin 2 (two) times a week. Wednesday & Saturday.    [provider]  famotidine (PEPCID) 20 MG tablet Take 1 tablet (20 mg total) by mouth 2 (two) times daily. 11/04/17   Recardo Evangelist, PA-C  guaiFENesin-dextromethorphan (ROBITUSSIN DM) 100-10 MG/5ML syrup Take 5 mLs by mouth every 4 (four) hours as needed for cough. 11/23/16   Clayton Bibles, PA-C  omeprazole (PRILOSEC) 20 MG capsule Take 1 capsule (20 mg total) by mouth 2 (two) times daily before a meal. 08/02/17   Forde Dandy, MD    Family History No family history on file.  Social History Social History   Tobacco Use  . Smoking status: Current Every Day Smoker    Packs/day: 0.50    Types: Cigarettes  . Smokeless tobacco: Never Used  Substance Use Topics  . Alcohol use: Yes    Comment: occasional  . Drug use: No     Allergies   Patient has no known allergies.   Review of Systems Review of Systems   Physical Exam Updated Vital Signs BP (!) 134/92 (BP Location: Right Arm)   Pulse 77   Temp 98.4 F (36.9 C) (Oral)   Resp 16   Ht 5\' 3"  (1.6 m)   Wt 72.6 kg (160  lb)   SpO2 98%   BMI 28.34 kg/m   Physical Exam  Constitutional: She is oriented to person, place, and time. She appears well-developed and well-nourished. No distress.  Well-appearing  HENT:  Head: Normocephalic and atraumatic.  Eyes: Conjunctivae are normal. Pupils are equal, round, and reactive to light. Right eye exhibits no discharge. Left eye exhibits no discharge. No scleral icterus.  Neck: Normal range of motion.  Cardiovascular: Normal rate and regular rhythm. Exam reveals no gallop and no friction rub.  No murmur heard. Pulmonary/Chest: Effort normal and breath sounds normal. No stridor. No respiratory distress. She has no wheezes. She has no rales. She exhibits no tenderness.  Abdominal: Soft.  Bowel sounds are normal. She exhibits no distension. There is no tenderness.  Neurological: She is alert and oriented to person, place, and time.  Skin: Skin is warm and dry.  Psychiatric: She has a normal mood and affect. Her behavior is normal.  Nursing note and vitals reviewed.   ED Treatments / Results  Labs (all labs ordered are listed, but only abnormal results are displayed) Labs Reviewed  CBC - Abnormal; Notable for the following components:      Result Value   HCT 33.6 (*)    MCV 70.1 (*)    MCH 25.3 (*)    All other components within normal limits  URINALYSIS, ROUTINE W REFLEX MICROSCOPIC - Abnormal; Notable for the following components:   Hgb urine dipstick SMALL (*)    All other components within normal limits  URINALYSIS, MICROSCOPIC (REFLEX) - Abnormal; Notable for the following components:   Bacteria, UA RARE (*)    Squamous Epithelial / LPF 0-5 (*)    All other components within normal limits  LIPASE, BLOOD  COMPREHENSIVE METABOLIC PANEL  PREGNANCY, URINE    EKG  EKG Interpretation None       Radiology No results found.  Procedures Procedures (including critical care time)  Medications Ordered in ED Medications  gi cocktail (Maalox,Lidocaine,Donnatal) (30 mLs Oral Given 11/04/17 1817)     Initial Impression / Assessment and Plan / ED Course  I have reviewed the triage vital signs and the nursing notes.  Pertinent labs & imaging results that were available during my care of the patient were reviewed by me and considered in my medical decision making (see chart for details).  42 year old female presents with epigastric abdominal pain. Likely gastritis from taking Ibuprofen. Vitals are normal. Abdomen is soft and minimally tender. She is well appearing. Labs are normal. UA is clean. EKG is normal. She had a recent cardiac work up at [facility] and is referred to have a stress test done. She feels improved after GI cocktail. Rx for Pepcid given. Advised  f/u with PCP. Return precautions given.  Final Clinical Impressions(s) / ED Diagnoses   Final diagnoses:  Epigastric abdominal pain    ED Discharge Orders        Ordered    famotidine (PEPCID) 20 MG tablet  2 times daily     11/04/17 1835       Recardo Evangelist, PA-C 11/04/17 2036    Charlesetta Shanks, MD 11/08/17 1800

## 2017-11-05 ENCOUNTER — Other Ambulatory Visit: Payer: Self-pay

## 2017-11-05 ENCOUNTER — Encounter (HOSPITAL_BASED_OUTPATIENT_CLINIC_OR_DEPARTMENT_OTHER): Payer: Self-pay | Admitting: *Deleted

## 2017-11-05 ENCOUNTER — Emergency Department (HOSPITAL_BASED_OUTPATIENT_CLINIC_OR_DEPARTMENT_OTHER)
Admission: EM | Admit: 2017-11-05 | Discharge: 2017-11-06 | Disposition: A | Discharge status: Home / Self Care | Payer: BLUE CROSS/BLUE SHIELD | Attending: Emergency Medicine | Admitting: Emergency Medicine

## 2017-11-05 DIAGNOSIS — F17210 Nicotine dependence, cigarettes, uncomplicated: Secondary | ICD-10-CM | POA: Insufficient documentation

## 2017-11-05 DIAGNOSIS — Z8541 Personal history of malignant neoplasm of cervix uteri: Secondary | ICD-10-CM | POA: Insufficient documentation

## 2017-11-05 DIAGNOSIS — R110 Nausea: Secondary | ICD-10-CM | POA: Insufficient documentation

## 2017-11-05 DIAGNOSIS — R42 Dizziness and giddiness: Secondary | ICD-10-CM | POA: Insufficient documentation

## 2017-11-05 MED ORDER — MECLIZINE HCL 25 MG PO TABS: 25.0000 mg | ORAL_TABLET | Freq: Three times a day (TID) | ORAL | 0 refills | Status: DC | PRN

## 2017-11-05 NOTE — ED Triage Notes (Signed)
Pt reports recurrent dizzy spells over the last week. Tonight she was watching TV and when she got up she felt "spinny". She reports she was evaluated for similar symptoms 2 weeks ago at [facility]. SHe was here yesterday and had normal EKG and was dx with GERD. Pt states symptoms are slowing resolving at this time

## 2017-11-05 NOTE — ED Notes (Signed)
Pt describes episode of watching TV earlier tonight, turning her head had feeling like the room was spinning. Lasted about 20 minutes. Has been worked up 2 weeks ago for CP and yesterday for acid reflux. Pt also reports drinking a 24 oz beer after work everyday for 6 months up until about 3 weeks ago when she stopped "cold [place]". Thinks this may have "messed up her system". Denies dizziness at present. Neuro WNL.

## 2017-11-05 NOTE — ED Provider Notes (Signed)
[facility] EMERGENCY DEPARTMENT Provider Note   CSN: [id number] Arrival date & time: 11/05/17  1945     History   Chief Complaint Chief Complaint  Patient presents with  . Dizziness    HPI Rupinder Livingston is a 42 y.o. female.  HPI Patient is a 42 year old female presents the emergency department with acute onset dizziness with associated nausea.  She is never had this before.  It is worse when she turned right.  No weakness of her arms or legs.  No fevers or chills.  No recent head injury or trauma.  Denies symptoms similar before.  Feels much better at this time.  Resolution of her symptoms.  Her symptoms lasted approximately 20 minutes.   Past Medical History:  Diagnosis Date  . Cancer ([place]) 2012   cervical stage 0  . Depression     There are no active problems to display for this patient.   Past Surgical History:  Procedure Laterality Date  . ABDOMINAL HYSTERECTOMY      OB History    No data available       Home Medications    Prior to Admission medications   Medication Sig Start Date End Date Taking? Authorizing Provider  famotidine (PEPCID) 20 MG tablet Take 1 tablet (20 mg total) by mouth 2 (two) times daily. 11/04/17  Yes Recardo Evangelist, PA-C  meclizine (ANTIVERT) 25 MG tablet Take 1 tablet (25 mg total) by mouth 3 (three) times daily as needed for dizziness. 11/05/17   Jola Schmidt, MD    Family History No family history on file.  Social History Social History   Tobacco Use  . Smoking status: Current Every Day Smoker    Packs/day: 0.50    Types: Cigarettes  . Smokeless tobacco: Never Used  Substance Use Topics  . Alcohol use: No    Frequency: Never    Comment: occasional  . Drug use: No     Allergies   Patient has no known allergies.   Review of Systems Review of Systems  All other systems reviewed and are negative.    Physical Exam Updated Vital Signs BP 131/86 (BP Location: Right Arm)   Pulse 78   Temp 98.1  F (36.7 C) (Oral)   Resp 20   Ht 5\' 3"  (1.6 m)   Wt 72.6 kg (160 lb)   SpO2 99%   BMI 28.34 kg/m   Physical Exam  Constitutional: She is oriented to person, place, and time. She appears well-developed and well-nourished. No distress.  HENT:  Head: Normocephalic and atraumatic.  Eyes: EOM are normal. Pupils are equal, round, and reactive to light.  Neck: Normal range of motion.  Cardiovascular: Normal rate, regular rhythm and normal heart sounds.  Pulmonary/Chest: Effort normal and breath sounds normal.  Abdominal: Soft. She exhibits no distension. There is no tenderness.  Musculoskeletal: Normal range of motion.  Neurological: She is alert and oriented to person, place, and time.  5/5 strength in major muscle groups of  bilateral upper and lower extremities. Speech normal. No facial asymetry.   Skin: Skin is warm and dry.  Psychiatric: She has a normal mood and affect. Judgment normal.  Nursing note and vitals reviewed.    ED Treatments / Results  Labs (all labs ordered are listed, but only abnormal results are displayed) Labs Reviewed - No data to display  EKG  EKG Interpretation  Date/Time:  Saturday November 05 2017 20:00:37 EST Ventricular Rate:  80 PR Interval:  160  QRS Duration: 90 QT Interval:  362 QTC Calculation: 417 R Axis:   56 Text Interpretation:  Normal sinus rhythm Normal ECG No significant change since last tracing Confirmed by Orlie Dakin [phone]) on 11/05/2017 8:12:27 PM       Radiology No results found.  Procedures Procedures (including critical care time)  Medications Ordered in ED Medications - No data to display   Initial Impression / Assessment and Plan / ED Course  I have reviewed the triage vital signs and the nursing notes.  Pertinent labs & imaging results that were available during my care of the patient were reviewed by me and considered in my medical decision making (see chart for details).     Overall well-appearing.   Resolution of symptoms.  Discharged home in good condition.  Final Clinical Impressions(s) / ED Diagnoses   Final diagnoses:  Acute onset of severe vertigo    ED Discharge Orders        Ordered    meclizine (ANTIVERT) 25 MG tablet  3 times daily PRN     11/05/17 2346       Jola Schmidt, MD 11/05/17 2349

## 2017-11-05 NOTE — ED Notes (Signed)
ED Provider at bedside. 

## 2017-11-06 NOTE — ED Notes (Signed)
Pt given d/c instructions as per chart. Rx x 1. Verbalizes understanding. No questions. 

## 2018-01-23 ENCOUNTER — Emergency Department (HOSPITAL_BASED_OUTPATIENT_CLINIC_OR_DEPARTMENT_OTHER): Payer: BLUE CROSS/BLUE SHIELD

## 2018-01-23 ENCOUNTER — Encounter (HOSPITAL_BASED_OUTPATIENT_CLINIC_OR_DEPARTMENT_OTHER): Payer: Self-pay | Admitting: *Deleted

## 2018-01-23 ENCOUNTER — Emergency Department (HOSPITAL_BASED_OUTPATIENT_CLINIC_OR_DEPARTMENT_OTHER)
Admission: EM | Admit: 2018-01-23 | Discharge: 2018-01-23 | Disposition: A | Discharge status: Home / Self Care | Payer: BLUE CROSS/BLUE SHIELD | Attending: Emergency Medicine | Admitting: Emergency Medicine

## 2018-01-23 ENCOUNTER — Other Ambulatory Visit: Payer: Self-pay

## 2018-01-23 DIAGNOSIS — R42 Dizziness and giddiness: Secondary | ICD-10-CM

## 2018-01-23 DIAGNOSIS — Z79899 Other long term (current) drug therapy: Secondary | ICD-10-CM | POA: Diagnosis not present

## 2018-01-23 DIAGNOSIS — F17210 Nicotine dependence, cigarettes, uncomplicated: Secondary | ICD-10-CM | POA: Insufficient documentation

## 2018-01-23 DIAGNOSIS — R51 Headache: Secondary | ICD-10-CM | POA: Diagnosis not present

## 2018-01-23 DIAGNOSIS — H538 Other visual disturbances: Secondary | ICD-10-CM | POA: Insufficient documentation

## 2018-01-23 DIAGNOSIS — Z8541 Personal history of malignant neoplasm of cervix uteri: Secondary | ICD-10-CM | POA: Diagnosis not present

## 2018-01-23 DIAGNOSIS — R110 Nausea: Secondary | ICD-10-CM | POA: Diagnosis not present

## 2018-01-23 HISTORY — DX: Dizziness and giddiness: R42

## 2018-01-23 LAB — BASIC METABOLIC PANEL
ANION GAP: 9 (ref 5–15)
BUN: 16 mg/dL (ref 6–20)
CALCIUM: 9.4 mg/dL (ref 8.9–10.3)
CO2: 22 mmol/L (ref 22–32)
Chloride: 107 mmol/L (ref 101–111)
Creatinine, Ser: 0.76 mg/dL (ref 0.44–1.00)
Glucose, Bld: 97 mg/dL (ref 65–99)
Potassium: 3.8 mmol/L (ref 3.5–5.1)
Sodium: 138 mmol/L (ref 135–145)

## 2018-01-23 LAB — CBC WITH DIFFERENTIAL/PLATELET
BASOS ABS: 0 10*3/uL (ref 0.0–0.1)
Basophils Relative: 0 %
EOS ABS: 0.2 10*3/uL (ref 0.0–0.7)
Eosinophils Relative: 2 %
HCT: 34.9 % — ABNORMAL LOW (ref 36.0–46.0)
Hemoglobin: 12.8 g/dL (ref 12.0–15.0)
Lymphocytes Relative: 44 %
Lymphs Abs: 4 10*3/uL (ref 0.7–4.0)
MCH: 25.7 pg — ABNORMAL LOW (ref 26.0–34.0)
MCHC: 36.7 g/dL — AB (ref 30.0–36.0)
MCV: 70.1 fL — ABNORMAL LOW (ref 78.0–100.0)
Monocytes Absolute: 0.7 10*3/uL (ref 0.1–1.0)
Monocytes Relative: 8 %
NEUTROS PCT: 46 %
Neutro Abs: 4.1 10*3/uL (ref 1.7–7.7)
Platelets: 282 10*3/uL (ref 150–400)
RBC: 4.98 MIL/uL (ref 3.87–5.11)
RDW: 14.8 % (ref 11.5–15.5)
WBC: 9 10*3/uL (ref 4.0–10.5)

## 2018-01-23 LAB — PREGNANCY, URINE: PREG TEST UR: NEGATIVE

## 2018-01-23 MED ORDER — IOPAMIDOL (ISOVUE-370) INJECTION 76%
100.0000 mL | Freq: Once | INTRAVENOUS | Status: AC | PRN
  Administered 2018-01-23: 100 mL via INTRAVENOUS

## 2018-01-23 MED ORDER — SODIUM CHLORIDE 0.9 % IV BOLUS
500.0000 mL | Freq: Once | INTRAVENOUS | Status: AC
  Administered 2018-01-23: 500 mL via INTRAVENOUS

## 2018-01-23 MED ORDER — MECLIZINE HCL 25 MG PO TABS: 25.0000 mg | ORAL_TABLET | Freq: Three times a day (TID) | ORAL | 0 refills | Status: AC | PRN

## 2018-01-23 MED ORDER — METOCLOPRAMIDE HCL 5 MG/ML IJ SOLN
10.0000 mg | Freq: Once | INTRAMUSCULAR | Status: AC
  Administered 2018-01-23: 10 mg via INTRAVENOUS
  Filled 2018-01-23: qty 2

## 2018-01-23 MED ORDER — ONDANSETRON 4 MG PO TBDP: 4.0000 mg | ORAL_TABLET | Freq: Three times a day (TID) | ORAL | 0 refills | Status: AC | PRN

## 2018-01-23 NOTE — ED Provider Notes (Signed)
[place] EMERGENCY DEPARTMENT Provider Note   CSN: [id number] Arrival date & time: 01/23/18  1855     History   Chief Complaint Chief Complaint  Patient presents with  . Dizziness    HPI Jillian Kramer is a 42 y.o. female.  The history is provided by the patient. No language interpreter was used.  Dizziness   Jillian Kramer is a 42 y.o. female who presents to the Emergency Department complaining of dizziness. She presents to the emergency department for evaluation of dizziness and headache for the last two weeks. She was diagnosed in the emergency department a few months ago with vertigo. 1 to 2 weeks ago she noted that she was having pain in the back of her neck and behind bilateral eyes. She describes it is a constant throbbing headache. She also reports increasing episodic vertigo over the last few weeks. Episodes wax and wane and are worse with activity. She describes it as a off-balance sensation. She also endorses blurry vision. She has nausea but no vomiting. Denies fevers, numbness, weakness. She has been taking meclizine with no significant improvement in her symptoms. She is also tried Tylenol for her headache with no change. She states the symptoms now are different than her prior vertigo symptoms. Past Medical History:  Diagnosis Date  . Cancer ([place]) 2012   cervical stage 0  . Depression   . Vertigo     There are no active problems to display for this patient.   Past Surgical History:  Procedure Laterality Date  . ABDOMINAL HYSTERECTOMY       OB History   None      Home Medications    Prior to Admission medications   Medication Sig Start Date End Date Taking? Authorizing Provider  famotidine (PEPCID) 20 MG tablet Take 1 tablet (20 mg total) by mouth 2 (two) times daily. 11/04/17  Yes Recardo Evangelist, PA-C  meclizine (ANTIVERT) 25 MG tablet Take 1 tablet (25 mg total) by mouth 3 (three) times daily as needed for dizziness. 01/23/18    Quintella Reichert, MD  ondansetron (ZOFRAN ODT) 4 MG disintegrating tablet Take 1 tablet (4 mg total) by mouth every 8 (eight) hours as needed for nausea or vomiting. 01/23/18   Quintella Reichert, MD    Family History History reviewed. No pertinent family history.  Social History Social History   Tobacco Use  . Smoking status: Current Every Day Smoker    Packs/day: 0.50    Types: Cigarettes  . Smokeless tobacco: Never Used  Substance Use Topics  . Alcohol use: No    Frequency: Never    Comment: occasional  . Drug use: No     Allergies   Patient has no known allergies.   Review of Systems Review of Systems  Neurological: Positive for dizziness.  All other systems reviewed and are negative.    Physical Exam Updated Vital Signs BP 110/82   Pulse 78   Temp 98.4 F (36.9 C) (Oral)   Resp 15   Ht 5\' 3"  (1.6 m)   Wt 77.1 kg (170 lb)   SpO2 94%   BMI 30.11 kg/m   Physical Exam  Constitutional: She is oriented to person, place, and time. She appears well-developed and well-nourished.  HENT:  Head: Normocephalic and atraumatic.  Eyes: Pupils are equal, round, and reactive to light. EOM are normal.  Neck: Neck supple.  Cardiovascular: Normal rate and regular rhythm.  No murmur heard. Pulmonary/Chest: Effort normal and breath sounds normal.  No respiratory distress.  Abdominal: Soft. There is no tenderness. There is no rebound and no guarding.  Musculoskeletal: She exhibits no edema or tenderness.  Neurological: She is alert and oriented to person, place, and time. No cranial nerve deficit.  Five out of five strength in all four extremities with sensational light touch intact in all four extremities. No pronated or drift. No ataxia on finger to nose bilaterally.  Skin: Skin is warm and dry.  Psychiatric: She has a normal mood and affect. Her behavior is normal.  Nursing note and vitals reviewed.    ED Treatments / Results  Labs (all labs ordered are listed, but only  abnormal results are displayed) Labs Reviewed  CBC WITH DIFFERENTIAL/PLATELET - Abnormal; Notable for the following components:      Result Value   HCT 34.9 (*)    MCV 70.1 (*)    MCH 25.7 (*)    MCHC 36.7 (*)    All other components within normal limits  BASIC METABOLIC PANEL  PREGNANCY, URINE    EKG EKG Interpretation  Date/Time:  Monday January 23 2018 19:59:49 EDT Ventricular Rate:  86 PR Interval:    QRS Duration: 97 QT Interval:  362 QTC Calculation: 433 R Axis:   51 Text Interpretation:  Sinus rhythm  RSR' in V1 or V2, probably normal variant Confirmed by Quintella Reichert [phone]) on 01/23/2018 8:18:56 PM   Radiology Ct Angio Head W/cm &/or Wo Cm  Result Date: 01/23/2018 CLINICAL DATA:  Initial evaluation for acute dizziness for 1 month, blurry vision. EXAM: CT ANGIOGRAPHY HEAD AND NECK TECHNIQUE: Multidetector CT imaging of the head and neck was performed using the standard protocol during bolus administration of intravenous contrast. Multiplanar CT image reconstructions and MIPs were obtained to evaluate the vascular anatomy. Carotid stenosis measurements (when applicable) are obtained utilizing NASCET criteria, using the distal internal carotid diameter as the denominator. CONTRAST:  122mL ISOVUE-370 IOPAMIDOL (ISOVUE-370) INJECTION 76% COMPARISON:  None. FINDINGS: CT HEAD FINDINGS Brain: Cerebral volume within normal limits. No acute intracranial hemorrhage. No acute large vessel territory infarct. No mass lesion, midline shift or mass effect. No hydrocephalus. No extra-axial fluid collection. Vascular: No hyperdense vessel. Skull: Scalp soft tissues and calvarium within normal limits. Sinuses: Gl paranasal sinuses and mastoid air cells are clear. Orbits: Globes and orbital soft tissues within normal limits. Review of the MIP images confirms the above findings CTA NECK FINDINGS Aortic arch: Aortic arch of normal caliber with normal 3 vessel morphology. No flow-limiting stenosis  about the origin of the great vessels. Visualized subclavian arteries widely patent. Right carotid system: Right common and internal carotid arteries widely patent without stenosis, dissection, or occlusion. No atheromatous narrowing about the right carotid bifurcation. Left carotid system: Left common and internal carotid arteries are widely patent without stenosis, dissection, or occlusion. No atheromatous narrowing about the left carotid bifurcation. Vertebral arteries: Both of the vertebral arteries arise from the subclavian arteries. Left vertebral artery slightly dominant. Vertebral arteries widely patent within the neck without stenosis, dissection, or occlusion. Skeleton: No acute osseous abnormality. No worrisome lytic or blastic osseous lesions. Mild degenerate spondylolysis present at C4-5 through C6-7. Other neck: No acute soft tissue abnormality within the neck. Salivary glands normal. Thyroid normal. No adenopathy. Upper chest: Visualized upper chest demonstrates no acute abnormality. Partially visualized lungs are largely clear. Review of the MIP images confirms the above findings CTA HEAD FINDINGS Anterior circulation: Petrous, cavernous, and supraclinoid ICAs widely patent without flow-limiting stenosis. ICA termini widely patent.  Left A1 segment widely patent. Hypoplastic right A1 segment, which likely accounts for the diminutive right ICA is compared to the left. Normal anterior communicating artery. Anterior cerebral arteries widely patent to their distal aspects without stenosis. M1 segments widely patent bilaterally. No proximal M2 occlusion. Distal MCA branches well perfused and symmetric. Posterior circulation: Vertebral arteries widely patent to the vertebrobasilar junction. Posterior inferior cerebral arteries patent bilaterally. Basilar artery widely patent to its distal aspect without stenosis. Superior cerebral arteries patent bilaterally. Both of the posterior cerebral artery supplied  via the basilar and are well perfused to their distal aspects without stenosis. Venous sinuses: Patent. Anatomic variants: None significant. No aneurysm or vascular abnormality. Delayed phase: No abnormal enhancement. Review of the MIP images confirms the above findings IMPRESSION: Normal CTA of the head and neck. No acute intracranial abnormality identified. Electronically Signed   By: Jeannine Boga M.D.   On: 01/23/2018 21:52   Ct Angio Neck W And/or Wo Contrast  Result Date: 01/23/2018 CLINICAL DATA:  Initial evaluation for acute dizziness for 1 month, blurry vision. EXAM: CT ANGIOGRAPHY HEAD AND NECK TECHNIQUE: Multidetector CT imaging of the head and neck was performed using the standard protocol during bolus administration of intravenous contrast. Multiplanar CT image reconstructions and MIPs were obtained to evaluate the vascular anatomy. Carotid stenosis measurements (when applicable) are obtained utilizing NASCET criteria, using the distal internal carotid diameter as the denominator. CONTRAST:  186mL ISOVUE-370 IOPAMIDOL (ISOVUE-370) INJECTION 76% COMPARISON:  None. FINDINGS: CT HEAD FINDINGS Brain: Cerebral volume within normal limits. No acute intracranial hemorrhage. No acute large vessel territory infarct. No mass lesion, midline shift or mass effect. No hydrocephalus. No extra-axial fluid collection. Vascular: No hyperdense vessel. Skull: Scalp soft tissues and calvarium within normal limits. Sinuses: Gl paranasal sinuses and mastoid air cells are clear. Orbits: Globes and orbital soft tissues within normal limits. Review of the MIP images confirms the above findings CTA NECK FINDINGS Aortic arch: Aortic arch of normal caliber with normal 3 vessel morphology. No flow-limiting stenosis about the origin of the great vessels. Visualized subclavian arteries widely patent. Right carotid system: Right common and internal carotid arteries widely patent without stenosis, dissection, or occlusion.  No atheromatous narrowing about the right carotid bifurcation. Left carotid system: Left common and internal carotid arteries are widely patent without stenosis, dissection, or occlusion. No atheromatous narrowing about the left carotid bifurcation. Vertebral arteries: Both of the vertebral arteries arise from the subclavian arteries. Left vertebral artery slightly dominant. Vertebral arteries widely patent within the neck without stenosis, dissection, or occlusion. Skeleton: No acute osseous abnormality. No worrisome lytic or blastic osseous lesions. Mild degenerate spondylolysis present at C4-5 through C6-7. Other neck: No acute soft tissue abnormality within the neck. Salivary glands normal. Thyroid normal. No adenopathy. Upper chest: Visualized upper chest demonstrates no acute abnormality. Partially visualized lungs are largely clear. Review of the MIP images confirms the above findings CTA HEAD FINDINGS Anterior circulation: Petrous, cavernous, and supraclinoid ICAs widely patent without flow-limiting stenosis. ICA termini widely patent. Left A1 segment widely patent. Hypoplastic right A1 segment, which likely accounts for the diminutive right ICA is compared to the left. Normal anterior communicating artery. Anterior cerebral arteries widely patent to their distal aspects without stenosis. M1 segments widely patent bilaterally. No proximal M2 occlusion. Distal MCA branches well perfused and symmetric. Posterior circulation: Vertebral arteries widely patent to the vertebrobasilar junction. Posterior inferior cerebral arteries patent bilaterally. Basilar artery widely patent to its distal aspect without stenosis. Superior cerebral arteries  patent bilaterally. Both of the posterior cerebral artery supplied via the basilar and are well perfused to their distal aspects without stenosis. Venous sinuses: Patent. Anatomic variants: None significant. No aneurysm or vascular abnormality. Delayed phase: No abnormal  enhancement. Review of the MIP images confirms the above findings IMPRESSION: Normal CTA of the head and neck. No acute intracranial abnormality identified. Electronically Signed   By: Jeannine Boga M.D.   On: 01/23/2018 21:52    Procedures Procedures (including critical care time)  Medications Ordered in ED Medications  sodium chloride 0.9 % bolus 500 mL (0 mLs Intravenous Stopped 01/23/18 2116)  metoCLOPramide (REGLAN) injection 10 mg (10 mg Intravenous Given 01/23/18 2018)  iopamidol (ISOVUE-370) 76 % injection 100 mL (100 mLs Intravenous Contrast Given 01/23/18 2043)     Initial Impression / Assessment and Plan / ED Course  I have reviewed the triage vital signs and the nursing notes.  Pertinent labs & imaging results that were available during my care of the patient were reviewed by me and considered in my medical decision making (see chart for details).    patient here for evaluation of vertigo, progressive posterior and frontal headache. She is neurologically intact on evaluation with no focal deficits. The vertigo is not reproducible on evaluation in the emergency department. Presentation is not consistent with subarachnoid hemorrhage, meningitis, general sinus thrombosis. CTA obtained to evaluate for posterior circulation abnormality. CT is negative for acute finding. Following treatment in the department her symptoms are resolved. On repeat assessment she is feeling improved. Discussed with patient home care for headache, vertigo. Discussed outpatient follow-up as well as return precautions.  Final Clinical Impressions(s) / ED Diagnoses   Final diagnoses:  Vertigo    ED Discharge Orders        Ordered    meclizine (ANTIVERT) 25 MG tablet  3 times daily PRN     01/23/18 2211    ondansetron (ZOFRAN ODT) 4 MG disintegrating tablet  Every 8 hours PRN     01/23/18 2211       Quintella Reichert, MD 01/24/18 [phone]

## 2018-01-23 NOTE — ED Triage Notes (Signed)
Pt c/o dizziness x 1 month , blurred vision x 1 week

## 2018-03-31 ENCOUNTER — Emergency Department (HOSPITAL_BASED_OUTPATIENT_CLINIC_OR_DEPARTMENT_OTHER)
Admission: EM | Admit: 2018-03-31 | Discharge: 2018-03-31 | Disposition: A | Discharge status: Home / Self Care | Payer: BLUE CROSS/BLUE SHIELD | Attending: Emergency Medicine | Admitting: Emergency Medicine

## 2018-03-31 ENCOUNTER — Encounter (HOSPITAL_BASED_OUTPATIENT_CLINIC_OR_DEPARTMENT_OTHER): Payer: Self-pay

## 2018-03-31 ENCOUNTER — Emergency Department (HOSPITAL_BASED_OUTPATIENT_CLINIC_OR_DEPARTMENT_OTHER): Payer: BLUE CROSS/BLUE SHIELD

## 2018-03-31 ENCOUNTER — Other Ambulatory Visit: Payer: Self-pay

## 2018-03-31 DIAGNOSIS — R1011 Right upper quadrant pain: Secondary | ICD-10-CM | POA: Diagnosis not present

## 2018-03-31 DIAGNOSIS — R079 Chest pain, unspecified: Secondary | ICD-10-CM | POA: Insufficient documentation

## 2018-03-31 DIAGNOSIS — F17210 Nicotine dependence, cigarettes, uncomplicated: Secondary | ICD-10-CM | POA: Diagnosis not present

## 2018-03-31 DIAGNOSIS — R1013 Epigastric pain: Secondary | ICD-10-CM | POA: Diagnosis not present

## 2018-03-31 DIAGNOSIS — Z8541 Personal history of malignant neoplasm of cervix uteri: Secondary | ICD-10-CM | POA: Diagnosis not present

## 2018-03-31 DIAGNOSIS — R110 Nausea: Secondary | ICD-10-CM | POA: Diagnosis not present

## 2018-03-31 DIAGNOSIS — R197 Diarrhea, unspecified: Secondary | ICD-10-CM | POA: Diagnosis not present

## 2018-03-31 DIAGNOSIS — Z79899 Other long term (current) drug therapy: Secondary | ICD-10-CM | POA: Diagnosis not present

## 2018-03-31 LAB — URINALYSIS, ROUTINE W REFLEX MICROSCOPIC
Bilirubin Urine: NEGATIVE
Glucose, UA: NEGATIVE mg/dL
Ketones, ur: NEGATIVE mg/dL
LEUKOCYTES UA: NEGATIVE
Nitrite: NEGATIVE
PH: 6 (ref 5.0–8.0)
Protein, ur: NEGATIVE mg/dL
Specific Gravity, Urine: >1.03 — ABNORMAL HIGH (ref 1.005–1.030)

## 2018-03-31 LAB — CBC WITH DIFFERENTIAL/PLATELET
Basophils Absolute: 0 10*3/uL (ref 0.0–0.1)
Basophils Relative: 0 %
Eosinophils Absolute: 0.2 10*3/uL (ref 0.0–0.7)
Eosinophils Relative: 2 %
HEMATOCRIT: 34.5 % — AB (ref 36.0–46.0)
Hemoglobin: 12.3 g/dL (ref 12.0–15.0)
LYMPHS ABS: 3.4 10*3/uL (ref 0.7–4.0)
LYMPHS PCT: 37 %
MCH: 25.1 pg — ABNORMAL LOW (ref 26.0–34.0)
MCHC: 35.7 g/dL (ref 30.0–36.0)
MCV: 70.3 fL — AB (ref 78.0–100.0)
Monocytes Absolute: 0.6 10*3/uL (ref 0.1–1.0)
Monocytes Relative: 7 %
NEUTROS PCT: 54 %
Neutro Abs: 4.8 10*3/uL (ref 1.7–7.7)
Platelets: 279 10*3/uL (ref 150–400)
RBC: 4.91 MIL/uL (ref 3.87–5.11)
RDW: 14.7 % (ref 11.5–15.5)
WBC: 9 10*3/uL (ref 4.0–10.5)

## 2018-03-31 LAB — COMPREHENSIVE METABOLIC PANEL
ALT: 31 U/L (ref 14–54)
ANION GAP: 9 (ref 5–15)
AST: 18 U/L (ref 15–41)
Albumin: 3.9 g/dL (ref 3.5–5.0)
Alkaline Phosphatase: 63 U/L (ref 38–126)
BUN: 19 mg/dL (ref 6–20)
CHLORIDE: 110 mmol/L (ref 101–111)
CO2: 22 mmol/L (ref 22–32)
CREATININE: 0.85 mg/dL (ref 0.44–1.00)
Calcium: 9.1 mg/dL (ref 8.9–10.3)
GFR calc non Af Amer: >60 mL/min (ref >60)
Glucose, Bld: 91 mg/dL (ref 65–99)
Potassium: 3.6 mmol/L (ref 3.5–5.1)
SODIUM: 141 mmol/L (ref 135–145)
Total Bilirubin: 0.4 mg/dL (ref 0.3–1.2)
Total Protein: 7.7 g/dL (ref 6.5–8.1)

## 2018-03-31 LAB — URINALYSIS, MICROSCOPIC (REFLEX): WBC, UA: NONE SEEN WBC/hpf (ref 0–5)

## 2018-03-31 LAB — LIPASE, BLOOD: Lipase: 29 U/L (ref 11–51)

## 2018-03-31 MED ORDER — SODIUM CHLORIDE 0.9 % IV BOLUS
1000.0000 mL | Freq: Once | INTRAVENOUS | Status: AC
  Administered 2018-03-31: 1000 mL via INTRAVENOUS

## 2018-03-31 MED ORDER — PANTOPRAZOLE SODIUM 20 MG PO TBEC: 20.0000 mg | DELAYED_RELEASE_TABLET | Freq: Every day | ORAL | 1 refills | Status: AC

## 2018-03-31 MED ORDER — SUCRALFATE 1 GM/10ML PO SUSP: 1.0000 g | Freq: Three times a day (TID) | ORAL | 0 refills | Status: AC

## 2018-03-31 MED ORDER — PANTOPRAZOLE SODIUM 40 MG PO TBEC
40.0000 mg | DELAYED_RELEASE_TABLET | Freq: Once | ORAL | Status: AC
  Administered 2018-03-31: 40 mg via ORAL
  Filled 2018-03-31: qty 1

## 2018-03-31 NOTE — Discharge Instructions (Addendum)
Acid Reflux Treatment  Diet: Start with a clear liquid diet, progressed to a full liquid diet, and then bland solids as you are able. Please adhere to the enclosed dietary suggestions.  In general, avoid NSAIDs (i.e. ibuprofen, naproxen, etc.), caffeine, alcohol, spicy foods, fatty foods, or any other foods that seem to cause your symptoms to arise.  Protonix: Take this medication daily, 20-30 minutes prior to your first meal, for the next 8 weeks.  Continue to take this medication even if you begin to feel better.  Zantac: Continue taking this medication.  Carafate: Begin taking this medication, as prescribed.   Follow-up: Please follow-up with the gastroenterologist or your primary care provider on this matter.  Return: Return to the ED for significantly worsening symptoms, persistent vomiting, persistent fever, vomiting blood, blood in the stools, dark stools, or any other major concerns.

## 2018-03-31 NOTE — ED Notes (Signed)
ED Provider at bedside. 

## 2018-03-31 NOTE — ED Triage Notes (Signed)
C/o abd pain x 4 days-"little bit of diarrhea"-NAD-steady gait

## 2018-03-31 NOTE — ED Provider Notes (Signed)
[place] EMERGENCY DEPARTMENT Provider Note   CSN: [id number] Arrival date & time: 03/31/18  1705     History   Chief Complaint Chief Complaint  Patient presents with  . Abdominal Pain    HPI Jillian Kramer is a 42 y.o. female.  HPI   Jillian Kramer is a 42 y.o. female, with a history of cervical cancer (stage 0), presenting to the ED with epigastric pain intermittent for the last few days.  Pain begins approximately 5 to 10 minutes following a meal, typically with fatty meals.  Pain is "suddenly sharp, then feels like gnawing," 7/10 at worst, radiating to right upper quadrant.  Has not experienced this pain before.  Accompanied by nausea, burning into the chest, and occasional instance of loose stool.  Found some relief with Pepto-Bismol.  Denies fever/chills, vomiting, hematochezia/melena, urinary symptoms, or any other complaints.  Patient has an appointment scheduled with [facility] on June 25.  This appointment is also meant to serve as a 5-year follow-up on her colonoscopy.  Polyps were found on the colonoscopy 5 years ago and she was placed on a every 5-year colonoscopy schedule.   Past Medical History:  Diagnosis Date  . Cancer ([place]) 2012   cervical stage 0  . Depression   . Vertigo     There are no active problems to display for this patient.   Past Surgical History:  Procedure Laterality Date  . ABDOMINAL HYSTERECTOMY       OB History   None      Home Medications    Prior to Admission medications   Medication Sig Start Date End Date Taking? Authorizing Provider  famotidine (PEPCID) 20 MG tablet Take 1 tablet (20 mg total) by mouth 2 (two) times daily. 11/04/17   Recardo Evangelist, PA-C  meclizine (ANTIVERT) 25 MG tablet Take 1 tablet (25 mg total) by mouth 3 (three) times daily as needed for dizziness. 01/23/18   Quintella Reichert, MD  ondansetron (ZOFRAN ODT) 4 MG disintegrating tablet Take 1 tablet (4 mg total) by mouth every  8 (eight) hours as needed for nausea or vomiting. 01/23/18   Quintella Reichert, MD  pantoprazole (PROTONIX) 20 MG tablet Take 1 tablet (20 mg total) by mouth daily. 03/31/18 05/30/18  Evita Merida C, PA-C  sucralfate (CARAFATE) 1 GM/10ML suspension Take 10 mLs (1 g total) by mouth 4 (four) times daily -  with meals and at bedtime. 03/31/18   Jadin Kagel, Helane Gunther, PA-C    Family History No family history on file.  Social History Social History   Tobacco Use  . Smoking status: Current Every Day Smoker    Packs/day: 0.50    Types: Cigarettes  . Smokeless tobacco: Never Used  Substance Use Topics  . Alcohol use: Yes    Frequency: Never    Comment: occasional  . Drug use: No     Allergies   Patient has no known allergies.   Review of Systems Review of Systems  Constitutional: Negative for chills, diaphoresis and fever.  Respiratory: Negative for shortness of breath.   Cardiovascular: Negative for chest pain.  Gastrointestinal: Positive for abdominal pain, diarrhea and nausea. Negative for blood in stool and vomiting.  Genitourinary: Negative for dysuria, frequency and hematuria.  All other systems reviewed and are negative.    Physical Exam Updated Vital Signs BP 115/78 (BP Location: Left Arm)   Pulse 98   Temp 98.3 F (36.8 C) (Oral)   Resp 20   Ht  5\' 3"  (1.6 m)   Wt 82.1 kg (181 lb)   SpO2 98%   BMI 32.06 kg/m   Physical Exam  Constitutional: She appears well-developed and well-nourished. No distress.  HENT:  Head: Normocephalic and atraumatic.  Eyes: Conjunctivae are normal.  Neck: Neck supple.  Cardiovascular: Normal rate, regular rhythm, normal heart sounds and intact distal pulses.  Pulmonary/Chest: Effort normal and breath sounds normal. No respiratory distress.  Abdominal: Soft. Bowel sounds are normal. There is no tenderness. There is no guarding.  No tenderness on exam.  Musculoskeletal: She exhibits no edema.  Lymphadenopathy:    She has no cervical adenopathy.    Neurological: She is alert.  Skin: Skin is warm and dry. She is not diaphoretic.  Psychiatric: She has a normal mood and affect. Her behavior is normal.  Nursing note and vitals reviewed.    ED Treatments / Results  Labs (all labs ordered are listed, but only abnormal results are displayed) Labs Reviewed  CBC WITH DIFFERENTIAL/PLATELET - Abnormal; Notable for the following components:      Result Value   HCT 34.5 (*)    MCV 70.3 (*)    MCH 25.1 (*)    All other components within normal limits  URINALYSIS, ROUTINE W REFLEX MICROSCOPIC - Abnormal; Notable for the following components:   Specific Gravity, Urine >1.030 (*)    Hgb urine dipstick TRACE (*)    All other components within normal limits  URINALYSIS, MICROSCOPIC (REFLEX) - Abnormal; Notable for the following components:   Bacteria, UA RARE (*)    All other components within normal limits  COMPREHENSIVE METABOLIC PANEL  LIPASE, BLOOD    EKG None  Radiology US Abdomen Limited Ruq  Result Date: 03/31/2018 CLINICAL DATA:  Postprandial epigastric pain and nausea for 4 days. EXAM: ULTRASOUND ABDOMEN LIMITED RIGHT UPPER QUADRANT COMPARISON:  CT, 08/10/2013 FINDINGS: Gallbladder: No gallstones or wall thickening visualized. No sonographic Murphy sign noted by sonographer. Common bile duct: Diameter: 2 mm Liver: No focal lesion identified. Within normal limits in parenchymal echogenicity. Portal vein is patent on color Doppler imaging with normal direction of blood flow towards the liver. Incidental note made of a 4.3 cm lower pole right renal cyst. IMPRESSION: 1. No acute findings. Normal gallbladder with no bile duct dilation. Electronically Signed   By: Lajean Manes M.D.   On: 03/31/2018 18:50    Procedures Procedures (including critical care time)  Medications Ordered in ED Medications  sodium chloride 0.9 % bolus 1,000 mL (1,000 mLs Intravenous New Bag/Given 03/31/18 1753)  pantoprazole (PROTONIX) EC tablet 40 mg (40  mg Oral Given 03/31/18 1900)     Initial Impression / Assessment and Plan / ED Course  I have reviewed the triage vital signs and the nursing notes.  Pertinent labs & imaging results that were available during my care of the patient were reviewed by me and considered in my medical decision making (see chart for details).     Patient presents with epigastric pain after eating.  Presentation suspicious for peptic ulcer disease versus biliary colic. Patient is nontoxic appearing, afebrile, not tachycardic, not tachypneic, not hypotensive, maintains SPO2 of 98% on room air, and is in no apparent distress.  Patient is already taking Zantac daily.  We will add in Protonix and Carafate.  She has appropriate follow-up already scheduled. The patient was given instructions for home care as well as return precautions. Patient voices understanding of these instructions, accepts the plan, and is comfortable with discharge.  Vitals:   03/31/18 1710 03/31/18 1917  BP: 115/78 114/74  Pulse: 98 76  Resp: 20 16  Temp: 98.3 F (36.8 C)   TempSrc: Oral   SpO2: 98% 98%  Weight: 82.1 kg (181 lb)   Height: 5\' 3"  (1.6 m)      Final Clinical Impressions(s) / ED Diagnoses   Final diagnoses:  RUQ pain  Epigastric pain    ED Discharge Orders        Ordered    pantoprazole (PROTONIX) 20 MG tablet  Daily     03/31/18 1901    sucralfate (CARAFATE) 1 GM/10ML suspension  3 times daily with meals & bedtime     03/31/18 1901       Layla Maw 03/31/18 1923    Drenda Freeze, MD 03/31/18 [phone]

## 2018-04-01 ENCOUNTER — Other Ambulatory Visit: Payer: Self-pay

## 2018-04-01 ENCOUNTER — Emergency Department (HOSPITAL_BASED_OUTPATIENT_CLINIC_OR_DEPARTMENT_OTHER)
Admission: EM | Admit: 2018-04-01 | Discharge: 2018-04-01 | Disposition: A | Discharge status: Home / Self Care | Payer: BLUE CROSS/BLUE SHIELD | Attending: Emergency Medicine | Admitting: Emergency Medicine

## 2018-04-01 ENCOUNTER — Emergency Department (HOSPITAL_BASED_OUTPATIENT_CLINIC_OR_DEPARTMENT_OTHER): Payer: BLUE CROSS/BLUE SHIELD

## 2018-04-01 ENCOUNTER — Encounter (HOSPITAL_BASED_OUTPATIENT_CLINIC_OR_DEPARTMENT_OTHER): Payer: Self-pay | Admitting: Emergency Medicine

## 2018-04-01 DIAGNOSIS — Z79899 Other long term (current) drug therapy: Secondary | ICD-10-CM | POA: Insufficient documentation

## 2018-04-01 DIAGNOSIS — F17210 Nicotine dependence, cigarettes, uncomplicated: Secondary | ICD-10-CM | POA: Insufficient documentation

## 2018-04-01 DIAGNOSIS — R0789 Other chest pain: Secondary | ICD-10-CM | POA: Insufficient documentation

## 2018-04-01 DIAGNOSIS — M25512 Pain in left shoulder: Secondary | ICD-10-CM | POA: Diagnosis not present

## 2018-04-01 DIAGNOSIS — R079 Chest pain, unspecified: Secondary | ICD-10-CM

## 2018-04-01 DIAGNOSIS — Z8541 Personal history of malignant neoplasm of cervix uteri: Secondary | ICD-10-CM | POA: Diagnosis not present

## 2018-04-01 DIAGNOSIS — R42 Dizziness and giddiness: Secondary | ICD-10-CM | POA: Insufficient documentation

## 2018-04-01 LAB — CBC
HCT: 34.3 % — ABNORMAL LOW (ref 36.0–46.0)
Hemoglobin: 12.2 g/dL (ref 12.0–15.0)
MCH: 25 pg — AB (ref 26.0–34.0)
MCHC: 35.6 g/dL (ref 30.0–36.0)
MCV: 70.3 fL — ABNORMAL LOW (ref 78.0–100.0)
PLATELETS: 269 10*3/uL (ref 150–400)
RBC: 4.88 MIL/uL (ref 3.87–5.11)
RDW: 14.8 % (ref 11.5–15.5)
WBC: 6 10*3/uL (ref 4.0–10.5)

## 2018-04-01 LAB — BASIC METABOLIC PANEL
Anion gap: 7 (ref 5–15)
BUN: 13 mg/dL (ref 6–20)
CALCIUM: 8.6 mg/dL — AB (ref 8.9–10.3)
CO2: 22 mmol/L (ref 22–32)
Chloride: 110 mmol/L (ref 101–111)
Creatinine, Ser: 0.73 mg/dL (ref 0.44–1.00)
GFR calc Af Amer: >60 mL/min (ref >60)
GLUCOSE: 102 mg/dL — AB (ref 65–99)
POTASSIUM: 3.7 mmol/L (ref 3.5–5.1)
Sodium: 139 mmol/L (ref 135–145)

## 2018-04-01 LAB — TROPONIN I

## 2018-04-01 NOTE — ED Triage Notes (Signed)
Central chest pain radiating to her L arm since yesterday. Pt seen here yesterday for abd pain.

## 2018-04-01 NOTE — ED Provider Notes (Signed)
[facility] EMERGENCY DEPARTMENT Provider Note   CSN: [id number] Arrival date & time: 04/01/18  0908     History   Chief Complaint Chief Complaint  Patient presents with  . Chest Pain    HPI Earlie Grose is a 42 y.o. female.  HPI 42 year old female presents with chest pain.  Started yesterday.  She was seen here for epigastric abdominal pain which feels like reflux yesterday.  That pain is been going on for about 4 days.  However this pain is been coming and going since yesterday.  It feels like a heaviness in her chest that lasts about 1 minute and then dissipates.  Has been coming and going.  Today she felt slightly lightheaded for a couple seconds when this occurred.  No shortness of breath, nausea, vomiting.  There is no current abdominal pain.  She thinks it might be coming from her left shoulder which has been giving her a problem with pain and discomfort for quite some time.  Nothing she does makes the pain better or worse.  There is no current pain.  There is no pleuritic pain.  No recent leg swelling or travel.  No exertional symptoms.  Past Medical History:  Diagnosis Date  . Cancer ([place]) 2012   cervical stage 0  . Depression   . Vertigo     There are no active problems to display for this patient.   Past Surgical History:  Procedure Laterality Date  . ABDOMINAL HYSTERECTOMY       OB History   None      Home Medications    Prior to Admission medications   Medication Sig Start Date End Date Taking? Authorizing Provider  famotidine (PEPCID) 20 MG tablet Take 1 tablet (20 mg total) by mouth 2 (two) times daily. 11/04/17   Recardo Evangelist, PA-C  meclizine (ANTIVERT) 25 MG tablet Take 1 tablet (25 mg total) by mouth 3 (three) times daily as needed for dizziness. 01/23/18   Quintella Reichert, MD  ondansetron (ZOFRAN ODT) 4 MG disintegrating tablet Take 1 tablet (4 mg total) by mouth every 8 (eight) hours as needed for nausea or vomiting. 01/23/18    Quintella Reichert, MD  pantoprazole (PROTONIX) 20 MG tablet Take 1 tablet (20 mg total) by mouth daily. 03/31/18 05/30/18  Joy, Shawn C, PA-C  sucralfate (CARAFATE) 1 GM/10ML suspension Take 10 mLs (1 g total) by mouth 4 (four) times daily -  with meals and at bedtime. 03/31/18   Joy, Helane Gunther, PA-C    Family History No family history on file.  Social History Social History   Tobacco Use  . Smoking status: Current Every Day Smoker    Packs/day: 0.50    Types: Cigarettes  . Smokeless tobacco: Never Used  Substance Use Topics  . Alcohol use: Yes    Frequency: Never    Comment: occasional  . Drug use: No     Allergies   Patient has no known allergies.   Review of Systems Review of Systems  Constitutional: Negative for diaphoresis.  Respiratory: Negative for shortness of breath.   Cardiovascular: Positive for chest pain.  Gastrointestinal: Negative for abdominal pain, nausea and vomiting.  Musculoskeletal: Positive for arthralgias (chronic, left shoulder).  Neurological: Positive for light-headedness.  All other systems reviewed and are negative.    Physical Exam Updated Vital Signs BP 109/73 (BP Location: Right Arm)   Pulse 73   Temp 98.3 F (36.8 C) (Oral)   Resp 18   Ht  5\' 3"  (1.6 m)   Wt 82.1 kg (181 lb)   SpO2 99%   BMI 32.06 kg/m   Physical Exam  Constitutional: She is oriented to person, place, and time. She appears well-developed and well-nourished. No distress.  HENT:  Head: Normocephalic and atraumatic.  Right Ear: External ear normal.  Left Ear: External ear normal.  Nose: Nose normal.  Eyes: Right eye exhibits no discharge. Left eye exhibits no discharge.  Cardiovascular: Normal rate, regular rhythm and normal heart sounds.  Pulses:      Radial pulses are 2+ on the right side, and 2+ on the left side.  Pulmonary/Chest: Effort normal and breath sounds normal. She exhibits no tenderness.  Abdominal: Soft. There is no tenderness.  Musculoskeletal: She  exhibits no edema.  Mild left shoulder muscular tenderness. Normal ROM  Neurological: She is alert and oriented to person, place, and time.  Skin: Skin is warm and dry. She is not diaphoretic.  Nursing note and vitals reviewed.    ED Treatments / Results  Labs (all labs ordered are listed, but only abnormal results are displayed) Labs Reviewed  CBC - Abnormal; Notable for the following components:      Result Value   HCT 34.3 (*)    MCV 70.3 (*)    MCH 25.0 (*)    All other components within normal limits  BASIC METABOLIC PANEL - Abnormal; Notable for the following components:   Glucose, Bld 102 (*)    Calcium 8.6 (*)    All other components within normal limits  TROPONIN I    EKG EKG Interpretation  Date/Time:  Saturday April 01 2018 09:16:34 EDT Ventricular Rate:  79 PR Interval:    QRS Duration: 85 QT Interval:  359 QTC Calculation: 412 R Axis:   50 Text Interpretation:  Normal sinus rhythm Atrial premature complex no acute ST/T changes no significant change since April 2019 Confirmed by Sherwood Gambler [id number]) on 04/01/2018 9:26:50 AM   Radiology Dg Chest 2 View  Result Date: 04/01/2018 CLINICAL DATA:  Left arm pain radiating to chest with dizziness this morning. EXAM: CHEST - 2 VIEW COMPARISON:  10/27/2017 FINDINGS: Lungs are adequately inflated with minimal linear scarring over the left midlung. No lobar consolidation or effusion. Cardiomediastinal silhouette and remainder of the exam is unchanged. IMPRESSION: No active cardiopulmonary disease. Electronically Signed   By: Marin Olp M.D.   On: 04/01/2018 09:44   US Abdomen Limited Ruq  Result Date: 03/31/2018 CLINICAL DATA:  Postprandial epigastric pain and nausea for 4 days. EXAM: ULTRASOUND ABDOMEN LIMITED RIGHT UPPER QUADRANT COMPARISON:  CT, 08/10/2013 FINDINGS: Gallbladder: No gallstones or wall thickening visualized. No sonographic Murphy sign noted by sonographer. Common bile duct: Diameter: 2 mm Liver: No  focal lesion identified. Within normal limits in parenchymal echogenicity. Portal vein is patent on color Doppler imaging with normal direction of blood flow towards the liver. Incidental note made of a 4.3 cm lower pole right renal cyst. IMPRESSION: 1. No acute findings. Normal gallbladder with no bile duct dilation. Electronically Signed   By: Lajean Manes M.D.   On: 03/31/2018 18:50    Procedures Procedures (including critical care time)  Medications Ordered in ED Medications - No data to display   Initial Impression / Assessment and Plan / ED Course  I have reviewed the triage vital signs and the nursing notes.  Pertinent labs & imaging results that were available during my care of the patient were reviewed by me and considered in my  medical decision making (see chart for details).     Patient's chest pain is pretty atypical.  She has no acute complaints right now.  The chest pains been on and off for about 24 hours.  Thus with a negative ECG and troponin I think her work-up is finished.  I highly doubt PE.  I offered we could do a second troponin but given my very low suspicion I do not think this is necessary.  Patient agrees and would like to follow-up with her PCP.  Discussed return precautions.  Final Clinical Impressions(s) / ED Diagnoses   Final diagnoses:  Nonspecific chest pain    ED Discharge Orders    None       Sherwood Gambler, MD 04/01/18 1520

## 2018-04-01 NOTE — Discharge Instructions (Addendum)
Return to the ER, call 911, or see your doctor if you develop recurrent or continuous chest pain, more severe pain, vomiting, shortness of breath, or any other new/concerning symptoms.

## 2019-07-06 ENCOUNTER — Other Ambulatory Visit: Payer: Self-pay

## 2019-07-06 ENCOUNTER — Emergency Department (HOSPITAL_BASED_OUTPATIENT_CLINIC_OR_DEPARTMENT_OTHER)
Admission: EM | Admit: 2019-07-06 | Discharge: 2019-07-06 | Disposition: A | Discharge status: Home / Self Care | Payer: BC Managed Care – PPO | Attending: Emergency Medicine | Admitting: Emergency Medicine

## 2019-07-06 ENCOUNTER — Encounter (HOSPITAL_BASED_OUTPATIENT_CLINIC_OR_DEPARTMENT_OTHER): Payer: Self-pay

## 2019-07-06 ENCOUNTER — Emergency Department (HOSPITAL_BASED_OUTPATIENT_CLINIC_OR_DEPARTMENT_OTHER): Payer: BC Managed Care – PPO

## 2019-07-06 DIAGNOSIS — Z79899 Other long term (current) drug therapy: Secondary | ICD-10-CM | POA: Diagnosis not present

## 2019-07-06 DIAGNOSIS — F17210 Nicotine dependence, cigarettes, uncomplicated: Secondary | ICD-10-CM | POA: Diagnosis not present

## 2019-07-06 DIAGNOSIS — G44209 Tension-type headache, unspecified, not intractable: Secondary | ICD-10-CM | POA: Diagnosis not present

## 2019-07-06 DIAGNOSIS — R51 Headache: Secondary | ICD-10-CM | POA: Diagnosis present

## 2019-07-06 HISTORY — DX: Pure hypercholesterolemia, unspecified: E78.00

## 2019-07-06 HISTORY — DX: Barrett's esophagus without dysplasia: K22.70

## 2019-07-06 MED ORDER — METHOCARBAMOL 500 MG PO TABS: 500.0000 mg | ORAL_TABLET | Freq: Three times a day (TID) | ORAL | 0 refills | Status: AC

## 2019-07-06 MED ORDER — GENERIC EXTERNAL MEDICATION
5.00 | Status: DC
Start: ? — End: 2019-07-06

## 2019-07-06 NOTE — ED Triage Notes (Signed)
Pt c/o HA started yesterday-seen at [facility] ED yesterday-felt better-woke with same HA this am-denies fever/flu like sx-NAD-steady gait

## 2019-07-06 NOTE — Discharge Instructions (Addendum)
Please take Ibuprofen (Advil, motrin) and Tylenol (acetaminophen) to relieve your pain.  You may take up to 600 MG (3 pills) of normal strength ibuprofen every 8 hours as needed.  In between doses of ibuprofen you make take tylenol, up to 1,000 mg (two extra strength pills).  Do not take more than 3,000 mg tylenol in a 24 hour period.  Please check all medication labels as many medications such as pain and cold medications may contain tylenol.  Do not drink alcohol while taking these medications.  Do not take other NSAID'S while taking ibuprofen (such as aleve or naproxen).  Please take ibuprofen with food to decrease stomach upset.     IMPRESSION:  1. No acute intracranial findings.  2. 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   You are being prescribed a medication which may make you sleepy. For 24 hours after one dose please do not drive, operate heavy machinery, care for a small child with out another adult present, or perform any activities that may cause harm to you or someone else if you were to fall asleep or be impaired.   If your symptoms change or worsen or you develop fevers, vision changes or have any new concerns please seek additional medical care and evaluation.

## 2019-07-06 NOTE — ED Provider Notes (Signed)
[facility] EMERGENCY DEPARTMENT Provider Note   CSN: [id number] Arrival date & time: 07/06/19  1615     History   Chief Complaint Chief Complaint  Patient presents with  . Headache    HPI Jillian Kramer is a 43 y.o. female with a past medical history of cervical cancer status post hysterectomy, depression, Barrett's esophagus who presents today for evaluation of headache.  She reports that intermittently over the past month she will get an occasional head ache.  She states that it feels like there is a tight band around the top of her head.  She denies any trauma.  She states that this seems to come and go.  She denies any vision changes.  No recent tick bites or trauma.  She denies any fevers or flulike symptoms.  She was seen yesterday at [facility] emergency room and treated with Decadron, Toradol, and Reglan after which her headache improved however when she woke up this morning it was back.  She states that she was reading on [employer] and is concerned as her mom was also reading on [employer] that she may have something serious going on.       HPI  Past Medical History:  Diagnosis Date  . Barrett esophagus   . Cancer ([place]) 2012   cervical stage 0  . Depression   . High cholesterol   . Vertigo     There are no active problems to display for this patient.   Past Surgical History:  Procedure Laterality Date  . ABDOMINAL HYSTERECTOMY       OB History   No obstetric history on file.      Home Medications    Prior to Admission medications   Medication Sig Start Date End Date Taking? Authorizing Provider  famotidine (PEPCID) 20 MG tablet Take 1 tablet (20 mg total) by mouth 2 (two) times daily. 11/04/17   Recardo Evangelist, PA-C  meclizine (ANTIVERT) 25 MG tablet Take 1 tablet (25 mg total) by mouth 3 (three) times daily as needed for dizziness. 01/23/18   Quintella Reichert, MD  methocarbamol (ROBAXIN) 500 MG tablet Take 1 tablet (500 mg total) by mouth 3  (three) times daily. 07/06/19   Lorin Glass, PA-C  ondansetron (ZOFRAN ODT) 4 MG disintegrating tablet Take 1 tablet (4 mg total) by mouth every 8 (eight) hours as needed for nausea or vomiting. 01/23/18   Quintella Reichert, MD  pantoprazole (PROTONIX) 20 MG tablet Take 1 tablet (20 mg total) by mouth daily. 03/31/18 05/30/18  Joy, Shawn C, PA-C  sucralfate (CARAFATE) 1 GM/10ML suspension Take 10 mLs (1 g total) by mouth 4 (four) times daily -  with meals and at bedtime. 03/31/18   Joy, Helane Gunther, PA-C    Family History No family history on file.  Social History Social History   Tobacco Use  . Smoking status: Current Every Day Smoker    Packs/day: 0.50    Types: Cigarettes  . Smokeless tobacco: Never Used  Substance Use Topics  . Alcohol use: Yes    Frequency: Never    Comment: occasional  . Drug use: No     Allergies   Patient has no known allergies.   Review of Systems Review of Systems  Constitutional: Negative for chills and fever.  HENT: Positive for ear pain and sinus pain. Negative for congestion, dental problem, ear discharge, facial swelling, mouth sores, postnasal drip, sinus pressure and sore throat.   Eyes: Negative for pain, discharge and  visual disturbance.  Respiratory: Negative for chest tightness and shortness of breath.   Cardiovascular: Negative for chest pain and palpitations.  Gastrointestinal: Negative for abdominal pain, diarrhea and nausea.  Genitourinary: Negative for dysuria.  Musculoskeletal: Negative for back pain, gait problem and neck pain.  Neurological: Positive for headaches. Negative for weakness and light-headedness.  All other systems reviewed and are negative.    Physical Exam Updated Vital Signs BP (!) 154/94 (BP Location: Right Arm)   Pulse 89   Temp 99 F (37.2 C) (Oral)   Resp 18   Ht 5\' 3"  (1.6 m)   Wt 81.6 kg   SpO2 100%   BMI 31.89 kg/m   Physical Exam Vitals signs and nursing note reviewed.  Constitutional:       General: She is not in acute distress.    Appearance: She is well-developed.  HENT:     Head: Normocephalic and atraumatic.     Right Ear: Tympanic membrane, ear canal and external ear normal.     Left Ear: Tympanic membrane, ear canal and external ear normal.     Mouth/Throat:     Mouth: Mucous membranes are moist.  Eyes:     General: No visual field deficit.    Extraocular Movements: Extraocular movements intact.     Conjunctiva/sclera: Conjunctivae normal.     Pupils: Pupils are equal, round, and reactive to light.  Neck:     Musculoskeletal: Normal range of motion and neck supple.  Cardiovascular:     Rate and Rhythm: Normal rate and regular rhythm.     Heart sounds: No murmur.  Pulmonary:     Effort: Pulmonary effort is normal. No respiratory distress.     Breath sounds: Normal breath sounds.  Abdominal:     Palpations: Abdomen is soft.     Tenderness: There is no abdominal tenderness.  Musculoskeletal: Normal range of motion.     Comments: There is diffuse tenderness to palpation on bilateral C and upper T-spine without midline tenderness to palpation.  Palpation in the upper bilateral cervical paraspinal muscles exacerbates her reported headache.  She reports tenderness to palpation over the top of her head and over her eyes bilaterally.     Skin:    General: Skin is warm and dry.  Neurological:     Mental Status: She is alert and oriented to person, place, and time.     GCS: GCS eye subscore is 4. GCS verbal subscore is 5. GCS motor subscore is 6.     Cranial Nerves: No cranial nerve deficit, dysarthria or facial asymmetry.     Sensory: No sensory deficit.     Motor: No weakness.     Gait: Gait normal.  Psychiatric:        Mood and Affect: Mood is anxious.        Behavior: Behavior normal.      ED Treatments / Results  Labs (all labs ordered are listed, but only abnormal results are displayed) Labs Reviewed - No data to display  EKG None  Radiology Ct Head  Wo Contrast  Result Date: 07/06/2019 CLINICAL DATA:  Headache. EXAM: CT HEAD WITHOUT CONTRAST TECHNIQUE: Contiguous axial images were obtained from the base of the skull through the vertex without intravenous contrast. COMPARISON:  None. FINDINGS: Brain: No acute intracranial hemorrhage. No focal mass lesion. No CT evidence of acute infarction. No midline shift or mass effect. No hydrocephalus. Basilar cisterns are patent. Benign dystrophic calcifications basal ganglia. 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 Vascular: No hyperdense vessel or unexpected calcification. Skull: Normal. Negative for fracture or focal lesion. Sinuses/Orbits: Paranasal sinuses and mastoid air cells are clear. Orbits are clear. Other: None. IMPRESSION: 1. No acute intracranial findings. 2. 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 Electronically Signed   By: Suzy Bouchard M.D.   On: 07/06/2019 18:19    Procedures Procedures (including critical care time)  Medications Ordered in ED Medications - No data to display   Initial Impression / Assessment and Plan / ED Course  I have reviewed the triage vital signs and the nursing notes.  Pertinent labs & imaging results that were available during my care of the patient were reviewed by me and considered in my medical decision making (see chart for details).       Patient presents today for evaluation of a headache.  She was seen yesterday at [facility] emergency room.  She does not have a significant history of migraines.  She was treated with Reglan, steroids, and Toradol yesterday which improved her headache however when she woke up this morning it was back.  She is anxious and has been reading online about possible causes of headaches and is concerned.  CT scan was obtained given she does not have a history of migraines which showed no evidence of acute abnormalities.  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    Her physical exam is consistent with a tension type headache.  She has significant pain in the cervical paraspinal muscles bilaterally which worsens the tension type band feeling around her head.  She is neurologically intact on exam.    Recommended conservative measures in addition to given a prescription for muscle relaxers.  She is instructed to follow-up with her primary care doctor regarding her ED visit and symptoms today along with her slight blood pressure elevation.  Return precautions were discussed with patient who states their understanding.  At the time of discharge patient denied any unaddressed complaints or concerns.  Patient is agreeable for discharge home.   Final Clinical Impressions(s) / ED Diagnoses   Final diagnoses:  Acute non intractable tension-type headache    ED Discharge Orders         Ordered    methocarbamol (ROBAXIN) 500 MG tablet  3 times daily     07/06/19 1907           Ollen Gross 07/06/19 1941    Blanchie Dessert, MD 07/07/19 0040

## 2020-03-10 IMAGING — CT CT HEAD W/O CM
3 series · 14 of 47 positions shown, 16 images · non-contrast
Comparison: None.

CLINICAL DATA: Headache.

EXAM:
CT HEAD WITHOUT CONTRAST
TECHNIQUE: Contiguous axial images were obtained from the base of the skull
through the vertex without intravenous contrast.

[Series 2: head wo · axial · 0.45mm/px · z∈[-164,-39]mm · 8 of 31 slices shown, 10 images]
[im 3/31  brain]
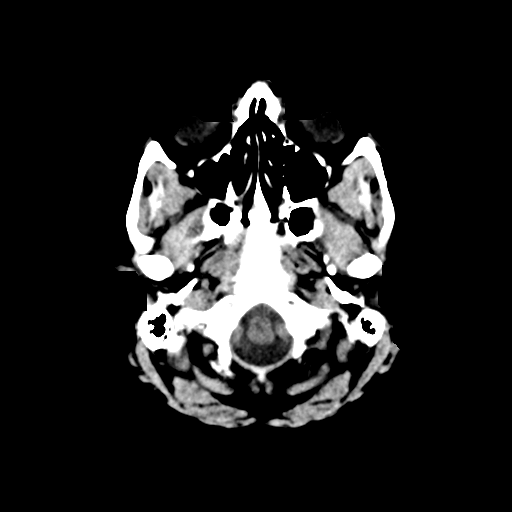
[im 3/31  bone]
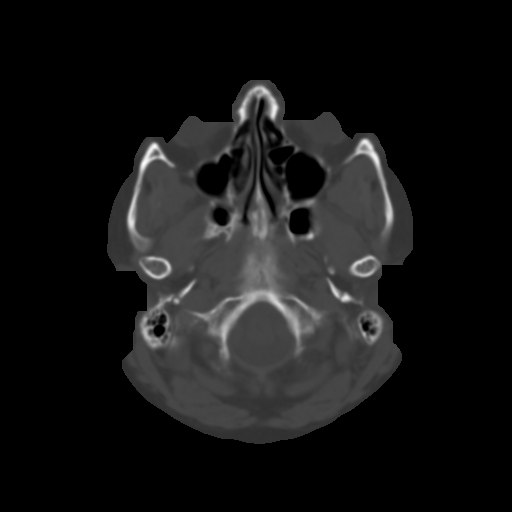
[im 7/31  brain]
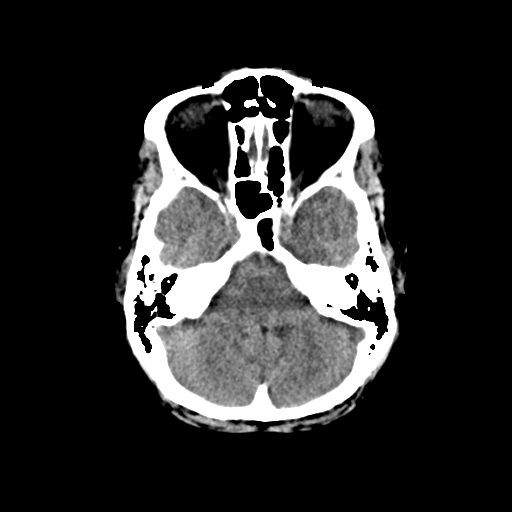
[im 10/31  brain]
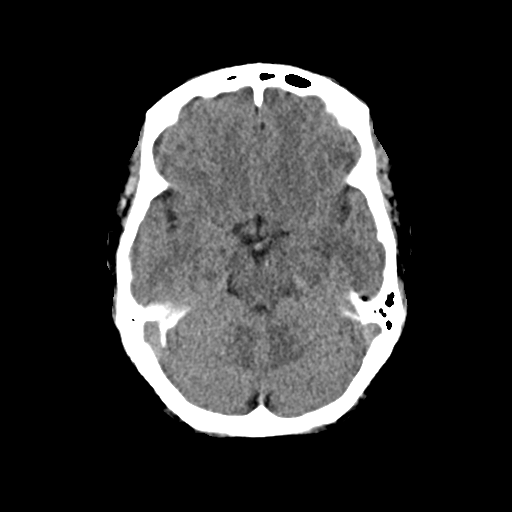
[im 14/31  brain]
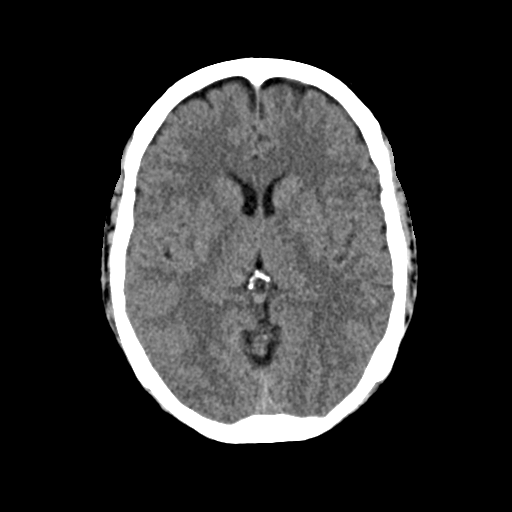
[im 17/31  brain]
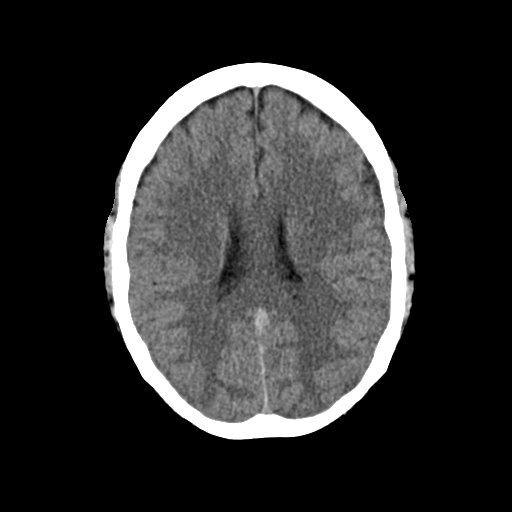
[im 17/31  bone]
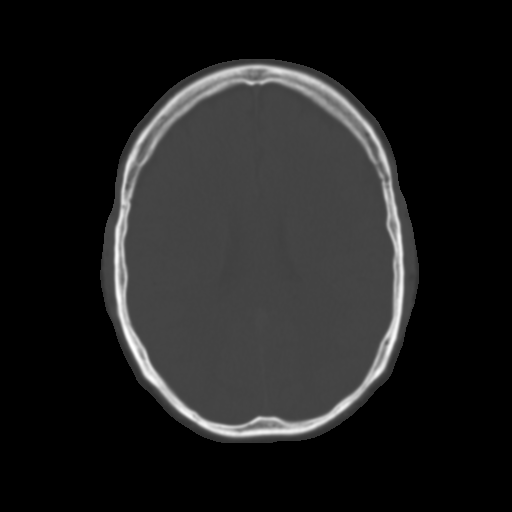
[im 21/31  brain]
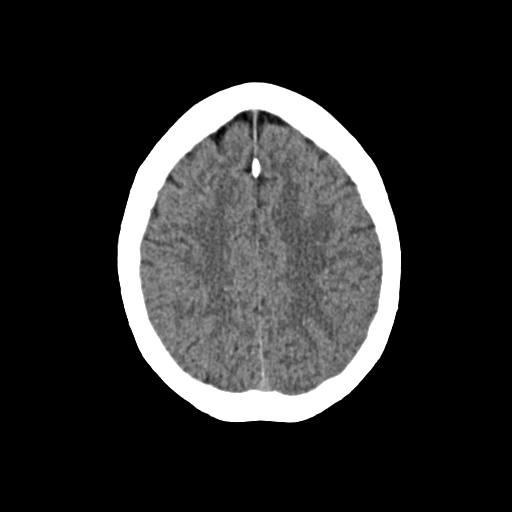
[im 24/31  brain]
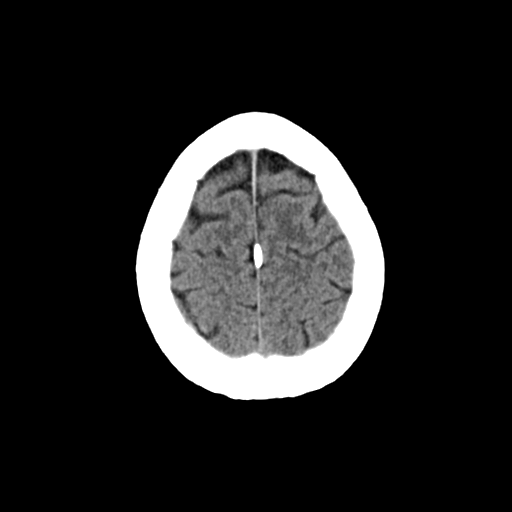
[im 28/31  brain]
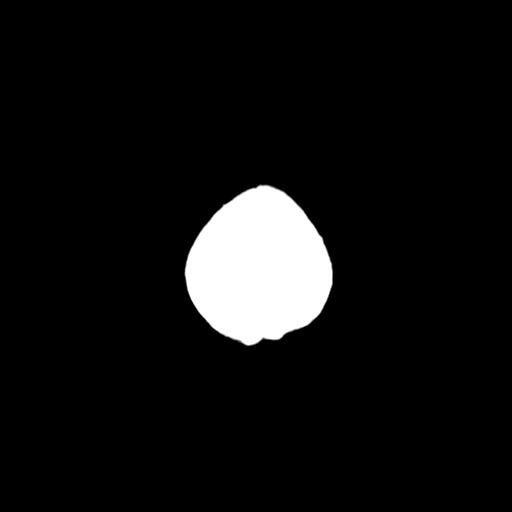

[Series 4: coronal soft · coronal · 0.30mm/px · 3 of 67 slices shown]
[im 23/67  brain]
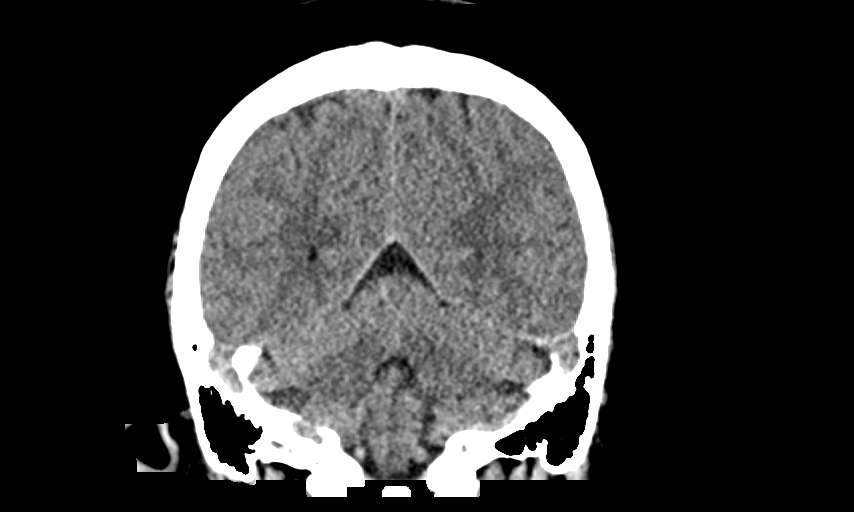
[im 30/67  brain]
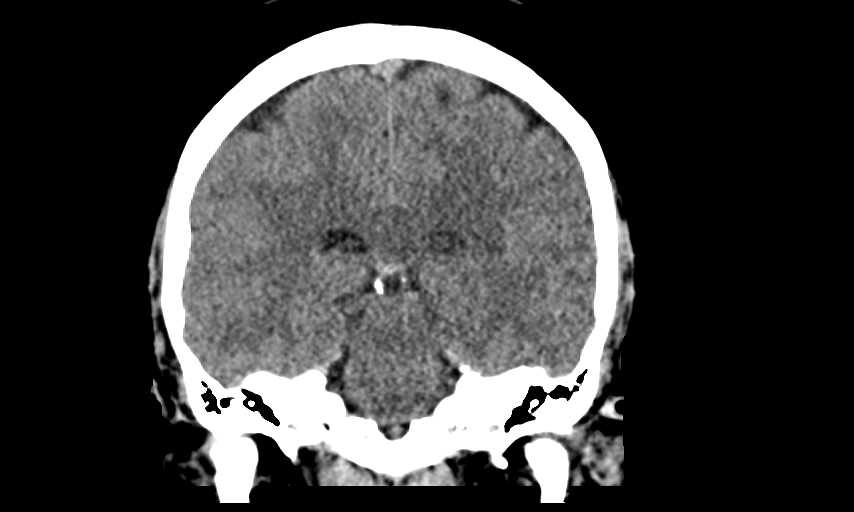
[im 37/67  brain]
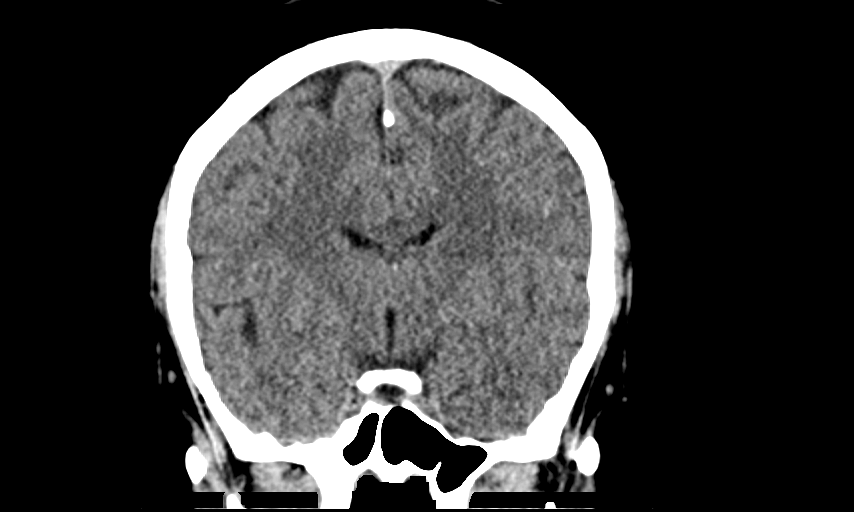

[Series 5: sag soft · sagittal · 0.30mm/px · 3 of 58 slices shown]
[im 20/58  brain]
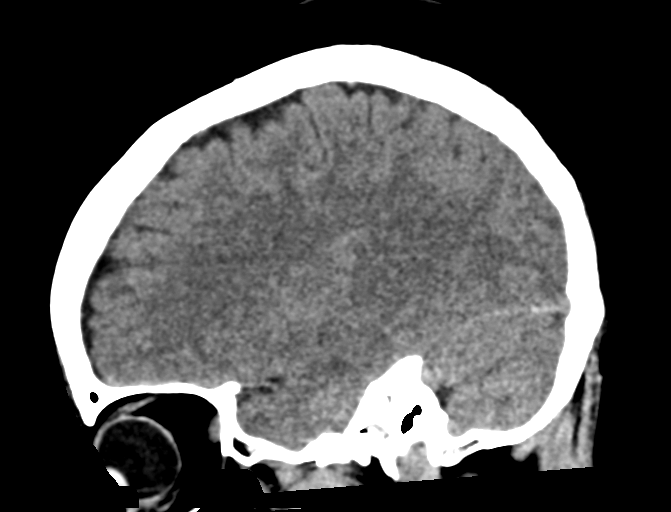
[im 29/58  brain]
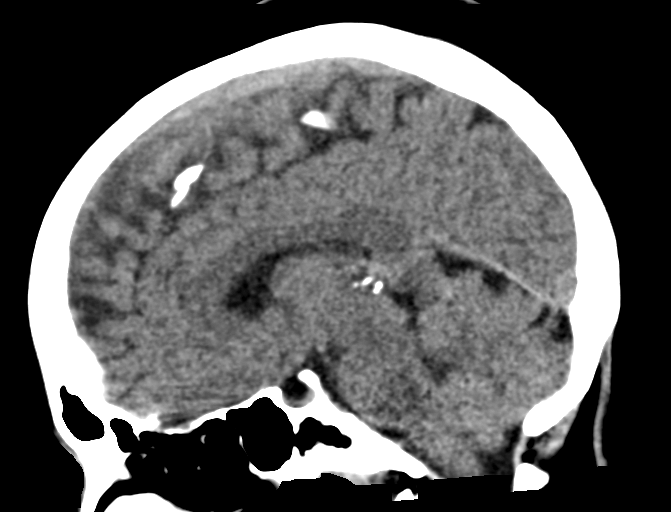
[im 39/58  brain]
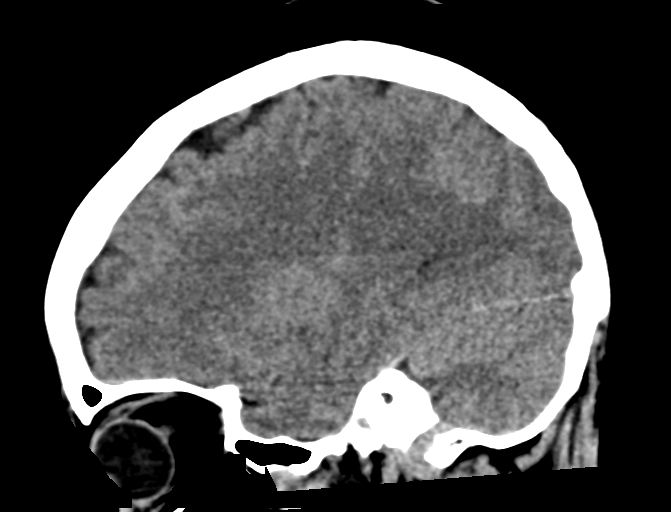

[14 of 47 positions shown; findings below may reference images not displayed]

FINDINGS: Brain: No acute intracranial hemorrhage. No focal mass lesion. No CT
evidence of acute infarction. No midline shift or mass effect. No
hydrocephalus. Basilar cisterns are patent.

Benign dystrophic calcifications basal ganglia. A small pineal gland
cyst measures 6 mm (image [DATE]).

Vascular: No hyperdense vessel or unexpected calcification.

Skull: Normal. Negative for fracture or focal lesion.

Sinuses/Orbits: Paranasal sinuses and mastoid air cells are clear.
Orbits are clear.

Other: None.
IMPRESSION: 1. No acute intracranial findings.
2. Benign findings including dystrophic calcifications of the basal
ganglia and pineal gland cyst.

## 2020-04-24 ENCOUNTER — Other Ambulatory Visit: Payer: Self-pay

## 2020-04-24 ENCOUNTER — Emergency Department (HOSPITAL_BASED_OUTPATIENT_CLINIC_OR_DEPARTMENT_OTHER): Payer: BC Managed Care – PPO

## 2020-04-24 ENCOUNTER — Emergency Department (HOSPITAL_BASED_OUTPATIENT_CLINIC_OR_DEPARTMENT_OTHER)
Admission: EM | Admit: 2020-04-24 | Discharge: 2020-04-24 | Disposition: A | Discharge status: Home / Self Care | Payer: BC Managed Care – PPO | Attending: Emergency Medicine | Admitting: Emergency Medicine

## 2020-04-24 ENCOUNTER — Encounter (HOSPITAL_BASED_OUTPATIENT_CLINIC_OR_DEPARTMENT_OTHER): Payer: Self-pay

## 2020-04-24 DIAGNOSIS — F17210 Nicotine dependence, cigarettes, uncomplicated: Secondary | ICD-10-CM | POA: Diagnosis not present

## 2020-04-24 DIAGNOSIS — G43809 Other migraine, not intractable, without status migrainosus: Secondary | ICD-10-CM | POA: Insufficient documentation

## 2020-04-24 DIAGNOSIS — Z79899 Other long term (current) drug therapy: Secondary | ICD-10-CM | POA: Insufficient documentation

## 2020-04-24 DIAGNOSIS — R0789 Other chest pain: Secondary | ICD-10-CM | POA: Diagnosis not present

## 2020-04-24 LAB — CBC
HCT: 36.7 % (ref 36.0–46.0)
Hemoglobin: 12.6 g/dL (ref 12.0–15.0)
MCH: 24.6 pg — ABNORMAL LOW (ref 26.0–34.0)
MCHC: 34.3 g/dL (ref 30.0–36.0)
MCV: 71.7 fL — ABNORMAL LOW (ref 80.0–100.0)
Platelets: 284 10*3/uL (ref 150–400)
RBC: 5.12 MIL/uL — ABNORMAL HIGH (ref 3.87–5.11)
RDW: 14.6 % (ref 11.5–15.5)
WBC: 8.4 10*3/uL (ref 4.0–10.5)
nRBC: 0 % (ref 0.0–0.2)

## 2020-04-24 LAB — BASIC METABOLIC PANEL
Anion gap: 11 (ref 5–15)
BUN: 16 mg/dL (ref 6–20)
CO2: 22 mmol/L (ref 22–32)
Calcium: 9.2 mg/dL (ref 8.9–10.3)
Chloride: 105 mmol/L (ref 98–111)
Creatinine, Ser: 0.71 mg/dL (ref 0.44–1.00)
GFR calc Af Amer: >60 mL/min (ref >60)
GFR calc non Af Amer: >60 mL/min (ref >60)
Glucose, Bld: 94 mg/dL (ref 70–99)
Potassium: 3.7 mmol/L (ref 3.5–5.1)
Sodium: 138 mmol/L (ref 135–145)

## 2020-04-24 LAB — TROPONIN I (HIGH SENSITIVITY)
Troponin I (High Sensitivity): 2 ng/L (ref <18)
Troponin I (High Sensitivity): 2 ng/L (ref <18)

## 2020-04-24 LAB — D-DIMER, QUANTITATIVE: D-Dimer, Quant: 0.28 ug/mL-FEU (ref 0.00–0.50)

## 2020-04-24 MED ORDER — ACETAMINOPHEN 325 MG PO TABS: 650.0000 mg | ORAL_TABLET | Freq: Once | ORAL | Status: AC

## 2020-04-24 MED ORDER — METOCLOPRAMIDE HCL 5 MG/ML IJ SOLN
10.0000 mg | Freq: Once | INTRAMUSCULAR | Status: AC
  Administered 2020-04-24: 10 mg via INTRAVENOUS
  Filled 2020-04-24: qty 2

## 2020-04-24 MED ORDER — ACETAMINOPHEN 325 MG PO TABS
ORAL_TABLET | ORAL | Status: AC
  Administered 2020-04-24: 650 mg via ORAL
  Filled 2020-04-24: qty 2

## 2020-04-24 MED ORDER — DIPHENHYDRAMINE HCL 50 MG/ML IJ SOLN: 25.0000 mg | Freq: Once | INTRAMUSCULAR | Status: DC

## 2020-04-24 MED ORDER — SODIUM CHLORIDE 0.9 % IV BOLUS
1000.0000 mL | Freq: Once | INTRAVENOUS | Status: AC
  Administered 2020-04-24: 1000 mL via INTRAVENOUS

## 2020-04-24 MED ORDER — SODIUM CHLORIDE 0.9% FLUSH
3.0000 mL | Freq: Once | INTRAVENOUS | Status: DC
  Filled 2020-04-24: qty 3

## 2020-04-24 NOTE — ED Notes (Signed)
ED Provider at bedside. 

## 2020-04-24 NOTE — ED Triage Notes (Addendum)
Pt c/o CP, upper back/posterior neck pain, HA x 2 days-denies fever/flu sx-NAD-steady gait

## 2020-04-24 NOTE — ED Provider Notes (Signed)
[facility] Provider Note   CSN: [id number] Arrival date & time: 04/24/20  1404     History Chief Complaint  Patient presents with  . Chest Pain    Nakeisha Lengyel is a 44 y.o. female with a past medical history of vertigo, hyperlipidemia presenting to the ED with chief complaint of chest pain and headache.  Starting on 04/20/2020 patient had a headache that radiated down her back.  States that this is typical of the headaches that she will usually get "a few times a year."  She took Tylenol when symptoms began without improvement but took Excedrin yesterday which alleviated her headache.  Headache returned today.  States that Excedrin usually helps with her headaches.  Starting on 04/21/2020 having left-sided chest pain that is pleuritic in nature.  Reports shortness of breath as well.  She is unsure if this is anxiety or if it is related to her headache.  She denies leg swelling, history of DVT, PE, MI or recent immobilization.  She was told in the past after having a Zio patch placed that she had "some abnormal beats in my heart. They wanted to put me on a beta blocker but I really just wanted to make sure everything was ok."  She denies any neck stiffness, fever, injuries or falls, numbness in arms or legs, cough, anticoagulant use.  HPI     Past Medical History:  Diagnosis Date  . Barrett esophagus   . Cancer ([place]) 2012   cervical stage 0  . Depression   . High cholesterol   . Vertigo     There are no problems to display for this patient.   Past Surgical History:  Procedure Laterality Date  . ABDOMINAL HYSTERECTOMY       OB History   No obstetric history on file.     No family history on file.  Social History   Tobacco Use  . Smoking status: Current Every Day Smoker    Packs/day: 0.50    Types: Cigarettes  . Smokeless tobacco: Never Used  Vaping Use  . Vaping Use: Never used  Substance Use Topics  . Alcohol use: Yes     Comment: occasional  . Drug use: No    Home Medications Prior to Admission medications   Medication Sig Start Date End Date Taking? Authorizing Provider  famotidine (PEPCID) 20 MG tablet Take 1 tablet (20 mg total) by mouth 2 (two) times daily. 11/04/17  Yes Recardo Evangelist, PA-C  meclizine (ANTIVERT) 25 MG tablet Take 1 tablet (25 mg total) by mouth 3 (three) times daily as needed for dizziness. 01/23/18  Yes Quintella Reichert, MD  pravastatin (PRAVACHOL) 40 MG tablet Take 40 mg by mouth daily.   Yes [provider]  methocarbamol (ROBAXIN) 500 MG tablet Take 1 tablet (500 mg total) by mouth 3 (three) times daily. 07/06/19   Lorin Glass, PA-C  ondansetron (ZOFRAN ODT) 4 MG disintegrating tablet Take 1 tablet (4 mg total) by mouth every 8 (eight) hours as needed for nausea or vomiting. 01/23/18   Quintella Reichert, MD  pantoprazole (PROTONIX) 20 MG tablet Take 1 tablet (20 mg total) by mouth daily. 03/31/18 05/30/18  Joy, Shawn C, PA-C  sucralfate (CARAFATE) 1 GM/10ML suspension Take 10 mLs (1 g total) by mouth 4 (four) times daily -  with meals and at bedtime. 03/31/18   Joy, Helane Gunther, PA-C    Allergies    Patient has no known allergies.  Review of  Systems   Review of Systems  Constitutional: Negative for appetite change, chills and fever.  HENT: Negative for ear pain, rhinorrhea, sneezing and sore throat.   Eyes: Negative for photophobia and visual disturbance.  Respiratory: Negative for cough, chest tightness, shortness of breath and wheezing.   Cardiovascular: Positive for chest pain. Negative for palpitations.  Gastrointestinal: Negative for abdominal pain, blood in stool, constipation, diarrhea, nausea and vomiting.  Genitourinary: Negative for dysuria, hematuria and urgency.  Musculoskeletal: Negative for myalgias.  Skin: Negative for rash.  Neurological: Positive for headaches. Negative for dizziness, weakness and light-headedness.    Physical Exam Updated Vital  Signs BP 112/81 (BP Location: Left Arm)   Pulse 84   Temp 98.7 F (37.1 C) (Oral)   Resp 16   Ht 5\' 3"  (1.6 m)   Wt 88.9 kg   SpO2 98%   BMI 34.72 kg/m   Physical Exam Vitals and nursing note reviewed.  Constitutional:      General: She is not in acute distress.    Appearance: She is well-developed.  HENT:     Head: Normocephalic and atraumatic.     Nose: Nose normal.  Eyes:     General: No scleral icterus.       Right eye: No discharge.        Left eye: No discharge.     Conjunctiva/sclera: Conjunctivae normal.     Pupils: Pupils are equal, round, and reactive to light.  Neck:     Comments: No meningismus. Cardiovascular:     Rate and Rhythm: Normal rate and regular rhythm.     Heart sounds: Normal heart sounds. No murmur heard.  No friction rub. No gallop.   Pulmonary:     Effort: Pulmonary effort is normal. No respiratory distress.     Breath sounds: Normal breath sounds.  Abdominal:     General: Bowel sounds are normal. There is no distension.     Palpations: Abdomen is soft.     Tenderness: There is no abdominal tenderness. There is no guarding.  Musculoskeletal:        General: Normal range of motion.     Cervical back: Normal range of motion and neck supple.     Right lower leg: No edema.     Left lower leg: No edema.     Comments: No lower extremity edema, erythema or calf tenderness bilaterally.  Skin:    General: Skin is warm and dry.     Findings: No rash.  Neurological:     General: No focal deficit present.     Mental Status: She is alert and oriented to person, place, and time.     Cranial Nerves: No cranial nerve deficit.     Sensory: No sensory deficit.     Motor: No weakness or abnormal muscle tone.     Coordination: Coordination normal.     Comments: Pupils reactive. No facial asymmetry noted. Cranial nerves appear grossly intact. Sensation intact to light touch on face, BUE and BLE. Strength 5/5 in BUE and BLE.      ED Results / Procedures  / Treatments   Labs (all labs ordered are listed, but only abnormal results are displayed) Labs Reviewed  CBC - Abnormal; Notable for the following components:      Result Value   RBC 5.12 (*)    MCV 71.7 (*)    MCH 24.6 (*)    All other components within normal limits  BASIC METABOLIC PANEL  D-DIMER, QUANTITATIVE (  NOT AT [facility])  TROPONIN I (HIGH SENSITIVITY)  TROPONIN I (HIGH SENSITIVITY)    EKG None  Radiology DG Chest 2 View  Result Date: 04/24/2020 CLINICAL DATA:  Chest pain. Additional history provided: Patient reports chest pain, upper back/posterior neck pain, headache for 2 days. EXAM: CHEST - 2 VIEW COMPARISON:  Prior chest radiographs 12/17/2019 and earlier. FINDINGS: Heart size within normal limits. There is no appreciable airspace consolidation. Minimal linear atelectasis and/or scarring within the left lung base. No evidence of pleural effusion or pneumothorax. No acute bony abnormality identified. IMPRESSION: Minimal linear atelectasis and/or scarring within the left lung base. Otherwise unremarkable examination. Electronically Signed   By: Kellie Simmering DO   On: 04/24/2020 14:53    Procedures Procedures (including critical care time)  Medications Ordered in ED Medications  sodium chloride flush (NS) 0.9 % injection 3 mL (3 mLs Intravenous Not Given 04/24/20 1454)  sodium chloride 0.9 % bolus 1,000 mL (0 mLs Intravenous Stopped 04/24/20 1633)  metoCLOPramide (REGLAN) injection 10 mg (10 mg Intravenous Given 04/24/20 1524)  acetaminophen (TYLENOL) tablet 650 mg (650 mg Oral Given 04/24/20 1524)    ED Course  I have reviewed the triage vital signs and the nursing notes.  Pertinent labs & imaging results that were available during my care of the patient were reviewed by me and considered in my medical decision making (see chart for details).  Clinical Course as of Apr 24 1742  Thu Apr 24, 2020  1426 EKG: Rate 106 Sinus tachycardia with PACs Otherwise normal   [CS]      Clinical Course User Index [CS] Truddie Hidden, MD   MDM Rules/Calculators/A&P                          44 year old female with a past medical history of vertigo, hyperlipidemia presenting to the ED with a chief complaint of chest pain and headache.  On 04/20/2020 started having a headache that radiated down her back.  Improvement noted with Excedrin but headache returned today.  The day after she started having a headache she began having left-sided chest pain that is pleuritic in nature along with palpitations.  She was told in the past after having a Zio patch placed that she had some "abnormal beats."  She denies any leg swelling, recent immobilization.  On exam patient is overall well-appearing.  Initial tachycardia to the 110s.  No lower extremity edema, erythema or calf tenderness that concern me for DVT.  EKG here shows sinus tachycardia with PACs.  No ischemic changes.  Chest x-ray shows no acute findings.  Due to elevated heart rate and pleuritic chest pain D-dimer obtained which was negative.  CBC, BMP and initial delta troponin are both negative.  Patient given Reglan, Tylenol and IV fluids here with resolution of all of her symptoms.  She has no neurological deficits on exam. There are no headache characteristics that are lateralizing or concerning for increased ICP, infectious or vascular cause of her symptoms.  She was wondering if she had a migraine I feel that this could be the cause of her symptoms today.  I am able to rule out ACS, PE, pneumothorax or other emergent cause of her symptoms.  We will have her continue home medications and follow-up with PCP.  All imaging, if done today, including plain films, CT scans, and ultrasounds, independently reviewed by me, and interpretations confirmed via formal radiology reads.  Patient is hemodynamically stable, in NAD,  and able to ambulate in the ED. Evaluation does not show pathology that would require ongoing emergent intervention or  inpatient treatment. I explained the diagnosis to the patient. Pain has been managed and has no complaints prior to discharge. Patient is comfortable with above plan and is stable for discharge at this time. All questions were answered prior to disposition. Strict return precautions for returning to the ED were discussed. Encouraged follow up with PCP.   An After Visit Summary was printed and given to the patient.   Portions of this note were generated with [employer]. Dictation errors may occur despite best attempts at proofreading.  Final Clinical Impression(s) / ED Diagnoses Final diagnoses:  Chest wall pain  Other migraine without status migrainosus, not intractable    Rx / DC Orders ED Discharge Orders    None       Delia Heady, PA-C 04/24/20 1743    Truddie Hidden, MD 04/25/20 [phone]

## 2020-04-24 NOTE — Discharge Instructions (Addendum)
Continue your home medications as previously prescribed. Follow-up with your primary care provider. Return to the ER for worsening, worsening headache with blurry vision, numbness in arms or legs or trouble breathing.

## 2020-06-08 ENCOUNTER — Other Ambulatory Visit: Payer: Self-pay

## 2020-06-08 ENCOUNTER — Emergency Department (HOSPITAL_BASED_OUTPATIENT_CLINIC_OR_DEPARTMENT_OTHER)
Admission: EM | Admit: 2020-06-08 | Discharge: 2020-06-08 | Discharge status: Against Medical Advice | Payer: BC Managed Care – PPO

## 2022-07-04 ENCOUNTER — Encounter (HOSPITAL_BASED_OUTPATIENT_CLINIC_OR_DEPARTMENT_OTHER): Payer: Self-pay | Admitting: Emergency Medicine

## 2022-07-04 ENCOUNTER — Other Ambulatory Visit: Payer: Self-pay

## 2022-07-04 ENCOUNTER — Emergency Department (HOSPITAL_BASED_OUTPATIENT_CLINIC_OR_DEPARTMENT_OTHER)
Admission: EM | Admit: 2022-07-04 | Discharge: 2022-07-04 | Disposition: A | Discharge status: Home / Self Care | Payer: BC Managed Care – PPO | Attending: Emergency Medicine | Admitting: Emergency Medicine

## 2022-07-04 DIAGNOSIS — G43019 Migraine without aura, intractable, without status migrainosus: Secondary | ICD-10-CM | POA: Diagnosis not present

## 2022-07-04 DIAGNOSIS — R42 Dizziness and giddiness: Secondary | ICD-10-CM

## 2022-07-04 DIAGNOSIS — R519 Headache, unspecified: Secondary | ICD-10-CM | POA: Diagnosis present

## 2022-07-04 LAB — BASIC METABOLIC PANEL
Anion gap: 7 (ref 5–15)
BUN: 14 mg/dL (ref 6–20)
CO2: 24 mmol/L (ref 22–32)
Calcium: 9.2 mg/dL (ref 8.9–10.3)
Chloride: 107 mmol/L (ref 98–111)
Creatinine, Ser: 0.74 mg/dL (ref 0.44–1.00)
GFR, Estimated: >60 mL/min (ref >60)
Glucose, Bld: 104 mg/dL — ABNORMAL HIGH (ref 70–99)
Potassium: 3.7 mmol/L (ref 3.5–5.1)
Sodium: 138 mmol/L (ref 135–145)

## 2022-07-04 LAB — CBC
HCT: 34.8 % — ABNORMAL LOW (ref 36.0–46.0)
Hemoglobin: 12 g/dL (ref 12.0–15.0)
MCH: 24.3 pg — ABNORMAL LOW (ref 26.0–34.0)
MCHC: 34.5 g/dL (ref 30.0–36.0)
MCV: 70.6 fL — ABNORMAL LOW (ref 80.0–100.0)
Platelets: 321 10*3/uL (ref 150–400)
RBC: 4.93 MIL/uL (ref 3.87–5.11)
RDW: 14.9 % (ref 11.5–15.5)
WBC: 8.6 10*3/uL (ref 4.0–10.5)
nRBC: 0 % (ref 0.0–0.2)

## 2022-07-04 MED ORDER — PROCHLORPERAZINE EDISYLATE 10 MG/2ML IJ SOLN
10.0000 mg | Freq: Once | INTRAMUSCULAR | Status: AC
  Administered 2022-07-04: 10 mg via INTRAVENOUS
  Filled 2022-07-04: qty 2

## 2022-07-04 MED ORDER — SODIUM CHLORIDE 0.9 % IV BOLUS
1000.0000 mL | Freq: Once | INTRAVENOUS | Status: AC
  Administered 2022-07-04: 1000 mL via INTRAVENOUS

## 2022-07-04 MED ORDER — KETOROLAC TROMETHAMINE 15 MG/ML IJ SOLN
15.0000 mg | Freq: Once | INTRAMUSCULAR | Status: AC
  Administered 2022-07-04: 15 mg via INTRAVENOUS
  Filled 2022-07-04: qty 1

## 2022-07-04 MED ORDER — DIPHENHYDRAMINE HCL 50 MG/ML IJ SOLN
12.5000 mg | Freq: Once | INTRAMUSCULAR | Status: AC
  Administered 2022-07-04: 12.5 mg via INTRAVENOUS
  Filled 2022-07-04: qty 1

## 2022-07-04 MED ORDER — PREDNISONE 50 MG PO TABS
60.0000 mg | ORAL_TABLET | Freq: Once | ORAL | Status: AC
  Administered 2022-07-04: 60 mg via ORAL
  Filled 2022-07-04: qty 1

## 2022-07-04 NOTE — ED Notes (Signed)
States that she has a headache,nausea ,dizziness. States  that it has been going on for three day. Denies any vomiting .States that she has some blurred vision

## 2022-07-04 NOTE — ED Provider Notes (Signed)
[facility] Provider Note   CSN: [id number] Arrival date & time: 07/04/22  1513     History  Chief Complaint  Patient presents with   Dizziness   Headache    Jillian Kramer is a 46 y.o. female with history of depression, vertigo, hyperlipidemia, and migraines who presents the emergency department complaining of headache for the past 3 days.  Patient states that she has had a headache worse behind her eyes, with associated dizziness.  She has been feeling slightly nauseous, without vomiting.  Has been trying Excedrin without relief.  Also complaining of mild blurred vision and some photophobia.  Reports feels similar to both headaches and vertigo, which she has had previously, but this might feel worse.  Has not had symptoms like this in several years.  Has needed to come to the ER for similar symptoms in the past.  Had best relief from vertigo with meclizine.   Dizziness Associated symptoms: headaches   Associated symptoms: no chest pain, no shortness of breath and no weakness   Headache Associated symptoms: dizziness and photophobia   Associated symptoms: no fever, no neck pain, no numbness and no weakness        Home Medications Prior to Admission medications   Medication Sig Start Date End Date Taking? Authorizing Provider  famotidine (PEPCID) 20 MG tablet Take 1 tablet (20 mg total) by mouth 2 (two) times daily. 11/04/17   Recardo Evangelist, PA-C  meclizine (ANTIVERT) 25 MG tablet Take 1 tablet (25 mg total) by mouth 3 (three) times daily as needed for dizziness. 01/23/18   Quintella Reichert, MD  methocarbamol (ROBAXIN) 500 MG tablet Take 1 tablet (500 mg total) by mouth 3 (three) times daily. 07/06/19   Kestrel Mis Glass, PA-C  ondansetron (ZOFRAN ODT) 4 MG disintegrating tablet Take 1 tablet (4 mg total) by mouth every 8 (eight) hours as needed for nausea or vomiting. 01/23/18   Quintella Reichert, MD  pantoprazole (PROTONIX) 20 MG tablet Take 1  tablet (20 mg total) by mouth daily. 03/31/18 05/30/18  Joy, Shawn C, PA-C  pravastatin (PRAVACHOL) 40 MG tablet Take 40 mg by mouth daily.    [provider]  sucralfate (CARAFATE) 1 GM/10ML suspension Take 10 mLs (1 g total) by mouth 4 (four) times daily -  with meals and at bedtime. 03/31/18   Joy, Helane Gunther, PA-C      Allergies    Patient has no known allergies.    Review of Systems   Review of Systems  Constitutional:  Negative for chills and fever.  Eyes:  Positive for photophobia and visual disturbance.  Respiratory:  Negative for shortness of breath.   Cardiovascular:  Negative for chest pain.  Musculoskeletal:  Negative for neck pain.  Neurological:  Positive for dizziness and headaches. Negative for syncope, facial asymmetry, speech difficulty, weakness, light-headedness and numbness.  All other systems reviewed and are negative.   Physical Exam Updated Vital Signs BP 120/79   Pulse 77   Temp 98.1 F (36.7 C) (Oral)   Resp 15   Ht '5\' 3"'$  (1.6 m)   Wt 81.6 kg   SpO2 96%   BMI 31.89 kg/m  Physical Exam Vitals and nursing note reviewed.  Constitutional:      Appearance: Normal appearance.  HENT:     Head: Normocephalic and atraumatic.  Eyes:     General: Lids are normal.     Extraocular Movements: Extraocular movements intact.     Conjunctiva/sclera: Conjunctivae  normal.     Pupils: Pupils are equal, round, and reactive to light.  Cardiovascular:     Rate and Rhythm: Normal rate and regular rhythm.  Pulmonary:     Effort: Pulmonary effort is normal. No respiratory distress.     Breath sounds: Normal breath sounds.  Abdominal:     General: There is no distension.     Palpations: Abdomen is soft.     Tenderness: There is no abdominal tenderness.  Skin:    General: Skin is warm and dry.  Neurological:     General: No focal deficit present.     Mental Status: She is alert.     Comments: Neuro: Speech is clear, able to follow commands. CN III-XII intact  grossly intact. Sensation intact throughout. Str 5/5 all extremities.     ED Results / Procedures / Treatments   Labs (all labs ordered are listed, but only abnormal results are displayed) Labs Reviewed  CBC - Abnormal; Notable for the following components:      Result Value   HCT 34.8 (*)    MCV 70.6 (*)    MCH 24.3 (*)    All other components within normal limits  BASIC METABOLIC PANEL - Abnormal; Notable for the following components:   Glucose, Bld 104 (*)    All other components within normal limits    EKG None  Radiology No results found.  Procedures Procedures    Medications Ordered in ED Medications  predniSONE (DELTASONE) tablet 60 mg (has no administration in time range)  sodium chloride 0.9 % bolus 1,000 mL (1,000 mLs Intravenous New Bag/Given 07/04/22 1634)  ketorolac (TORADOL) 15 MG/ML injection 15 mg (15 mg Intravenous Given 07/04/22 1633)  diphenhydrAMINE (BENADRYL) injection 12.5 mg (12.5 mg Intravenous Given 07/04/22 1633)  prochlorperazine (COMPAZINE) injection 10 mg (10 mg Intravenous Given 07/04/22 1633)    ED Course/ Medical Decision Making/ A&P                           Medical Decision Making Amount and/or Complexity of Data Reviewed Labs: ordered.  Risk Prescription drug management.  This patient is a 46 y.o. female  who presents to the ED for concern of headache and dizziness x 3 days.   Differential diagnoses prior to evaluation: The emergent differential diagnosis includes, but is not limited to,  Intracranial hemorrhage, meningitis, CVA, intracranial tumor, venous sinus thrombosis, migraine, cluster headache, hypertension, drug related, pseudotumor cerebri, AVM, head injury, tension headache, sinusitis, dental abscess, otitis media, TMJ, depression. This is not an exhaustive differential.   Past Medical History / Co-morbidities: Depression, vertigo, hyperlipidemia, and migraines  Additional history: Chart reviewed. Pertinent results  include: Patient previously seen in this ER for similar symptoms back in 2020.  Had a normal CT scan of her head, and had resolution of her symptoms after migraine cocktail.  Physical Exam: Physical exam performed. The pertinent findings include: Hypertensive, otherwise normal vital signs.  Normal neurologic exam as above.  Some photophobia.  No meningeal signs.  Lab Tests/Imaging studies: I personally interpreted labs/imaging and the pertinent results include: CBC and BMP unremarkable.  As patient has had a previously normal CT of her head in the setting of headaches, there is been no significant change in the character of pain, will defer further radiation at this time.  Medications: I ordered medication including migraine cocktail.  I have reviewed the patients home medicines and have made adjustments as needed.  Upon reevaluation  patient states that her symptoms are significantly improved.  Also given a one-time dose of steroids to help with migraine recurrence.   Disposition: After consideration of the diagnostic results and the patients response to treatment, I feel that emergency department workup does not suggest an emergent condition requiring admission or immediate intervention beyond what has been performed at this time. The plan is: Discharge home with continued symptomatic management of headaches and migraines.  Patient states her dizziness has also resolved, while I offered refill of her previous meclizine prescription, patient does not feel she needs this at this time. The patient is safe for discharge and has been instructed to return immediately for worsening symptoms, change in symptoms or any other concerns.  Final Clinical Impression(s) / ED Diagnoses Final diagnoses:  Intractable migraine without aura and without status migrainosus  Dizziness    Rx / DC Orders ED Discharge Orders     None      Portions of this report may have been transcribed using voice recognition  software. Every effort was made to ensure accuracy; however, inadvertent computerized transcription errors may be present.    Estill Cotta 07/04/22 1743    Charlesetta Shanks, MD 07/11/22 [phone]

## 2022-07-04 NOTE — ED Triage Notes (Signed)
Pt c/o dizziness, nausea, HA x 3d; worse today

## 2022-07-04 NOTE — Discharge Instructions (Addendum)
You were seen in the emergency department today for migraine dizziness.  We checked some basic blood work which was normal.  We also gave you a migraine cocktail of several medicines, and I am glad that you are feeling much better.  I also gave you a one-time dose of some steroids, which should help the migraine from coming back.  Continue to monitor how you are doing and return to the emergency department for any new or worsening symptoms.

## 2023-02-04 ENCOUNTER — Encounter (HOSPITAL_COMMUNITY)
Admission: EM | Disposition: A | Discharge status: Home / Self Care | Payer: Self-pay | Source: Home / Self Care | Attending: Emergency Medicine

## 2023-02-04 ENCOUNTER — Other Ambulatory Visit: Payer: Self-pay

## 2023-02-04 ENCOUNTER — Inpatient Hospital Stay: Admit: 2023-02-04 | Payer: BLUE CROSS/BLUE SHIELD | Admitting: General Surgery

## 2023-02-04 ENCOUNTER — Encounter (HOSPITAL_BASED_OUTPATIENT_CLINIC_OR_DEPARTMENT_OTHER): Payer: Self-pay

## 2023-02-04 ENCOUNTER — Observation Stay (HOSPITAL_BASED_OUTPATIENT_CLINIC_OR_DEPARTMENT_OTHER)
Admission: EM | Admit: 2023-02-04 | Discharge: 2023-02-05 | Disposition: A | Discharge status: Home / Self Care | Payer: BLUE CROSS/BLUE SHIELD | Attending: General Surgery | Admitting: General Surgery

## 2023-02-04 ENCOUNTER — Emergency Department (HOSPITAL_COMMUNITY): Payer: BLUE CROSS/BLUE SHIELD | Admitting: Anesthesiology

## 2023-02-04 ENCOUNTER — Emergency Department (HOSPITAL_BASED_OUTPATIENT_CLINIC_OR_DEPARTMENT_OTHER): Payer: BLUE CROSS/BLUE SHIELD

## 2023-02-04 DIAGNOSIS — K3580 Unspecified acute appendicitis: Principal | ICD-10-CM | POA: Insufficient documentation

## 2023-02-04 DIAGNOSIS — F17210 Nicotine dependence, cigarettes, uncomplicated: Secondary | ICD-10-CM | POA: Diagnosis not present

## 2023-02-04 DIAGNOSIS — K5792 Diverticulitis of intestine, part unspecified, without perforation or abscess without bleeding: Secondary | ICD-10-CM | POA: Diagnosis not present

## 2023-02-04 DIAGNOSIS — R1031 Right lower quadrant pain: Secondary | ICD-10-CM | POA: Diagnosis present

## 2023-02-04 DIAGNOSIS — K37 Unspecified appendicitis: Secondary | ICD-10-CM

## 2023-02-04 DIAGNOSIS — Z8541 Personal history of malignant neoplasm of cervix uteri: Secondary | ICD-10-CM | POA: Insufficient documentation

## 2023-02-04 HISTORY — PX: LAPAROSCOPIC APPENDECTOMY: SHX408

## 2023-02-04 LAB — URINALYSIS, MICROSCOPIC (REFLEX)

## 2023-02-04 LAB — CBC
HCT: 37.3 % (ref 36.0–46.0)
Hemoglobin: 12.7 g/dL (ref 12.0–15.0)
MCH: 24.1 pg — ABNORMAL LOW (ref 26.0–34.0)
MCHC: 34 g/dL (ref 30.0–36.0)
MCV: 70.8 fL — ABNORMAL LOW (ref 80.0–100.0)
Platelets: 332 10*3/uL (ref 150–400)
RBC: 5.27 MIL/uL — ABNORMAL HIGH (ref 3.87–5.11)
RDW: 14.9 % (ref 11.5–15.5)
WBC: 9.5 10*3/uL (ref 4.0–10.5)
nRBC: 0 % (ref 0.0–0.2)

## 2023-02-04 LAB — COMPREHENSIVE METABOLIC PANEL
ALT: 22 U/L (ref 0–44)
AST: 22 U/L (ref 15–41)
Albumin: 4.2 g/dL (ref 3.5–5.0)
Alkaline Phosphatase: 69 U/L (ref 38–126)
Anion gap: 8 (ref 5–15)
BUN: 11 mg/dL (ref 6–20)
CO2: 23 mmol/L (ref 22–32)
Calcium: 9.5 mg/dL (ref 8.9–10.3)
Chloride: 103 mmol/L (ref 98–111)
Creatinine, Ser: 0.76 mg/dL (ref 0.44–1.00)
GFR, Estimated: >60 mL/min (ref >60)
Glucose, Bld: 103 mg/dL — ABNORMAL HIGH (ref 70–99)
Potassium: 3.8 mmol/L (ref 3.5–5.1)
Sodium: 134 mmol/L — ABNORMAL LOW (ref 135–145)
Total Bilirubin: 0.6 mg/dL (ref 0.3–1.2)
Total Protein: 8.3 g/dL — ABNORMAL HIGH (ref 6.5–8.1)

## 2023-02-04 LAB — URINALYSIS, ROUTINE W REFLEX MICROSCOPIC
Bilirubin Urine: NEGATIVE
Glucose, UA: NEGATIVE mg/dL
Ketones, ur: NEGATIVE mg/dL
Leukocytes,Ua: NEGATIVE
Nitrite: NEGATIVE
Protein, ur: NEGATIVE mg/dL
Specific Gravity, Urine: 1.01 (ref 1.005–1.030)
pH: 5.5 (ref 5.0–8.0)

## 2023-02-04 LAB — LIPASE, BLOOD: Lipase: 27 U/L (ref 11–51)

## 2023-02-04 SURGERY — APPENDECTOMY, LAPAROSCOPIC
Anesthesia: General | Site: Abdomen

## 2023-02-04 MED ORDER — LACTATED RINGERS IV SOLN: INTRAVENOUS | Status: DC | PRN

## 2023-02-04 MED ORDER — PRAVASTATIN SODIUM 20 MG PO TABS
40.0000 mg | ORAL_TABLET | Freq: Every day | ORAL | Status: DC
  Administered 2023-02-05: 40 mg via ORAL
  Filled 2023-02-04: qty 2

## 2023-02-04 MED ORDER — SUCCINYLCHOLINE CHLORIDE 200 MG/10ML IV SOSY
PREFILLED_SYRINGE | INTRAVENOUS | Status: DC | PRN
  Administered 2023-02-04: 140 mg via INTRAVENOUS

## 2023-02-04 MED ORDER — LIDOCAINE 2% (20 MG/ML) 5 ML SYRINGE
INTRAMUSCULAR | Status: DC | PRN
  Administered 2023-02-04: 80 mg via INTRAVENOUS

## 2023-02-04 MED ORDER — ACETAMINOPHEN 160 MG/5ML PO SOLN: 325.0000 mg | ORAL | Status: DC | PRN

## 2023-02-04 MED ORDER — SODIUM CHLORIDE 0.9 % IV SOLN
2.0000 g | Freq: Once | INTRAVENOUS | Status: AC
  Administered 2023-02-04: 2 g via INTRAVENOUS
  Filled 2023-02-04: qty 20

## 2023-02-04 MED ORDER — IOHEXOL 300 MG/ML  SOLN
100.0000 mL | Freq: Once | INTRAMUSCULAR | Status: AC | PRN
  Administered 2023-02-04: 100 mL via INTRAVENOUS

## 2023-02-04 MED ORDER — KETOROLAC TROMETHAMINE 30 MG/ML IJ SOLN
30.0000 mg | Freq: Once | INTRAMUSCULAR | Status: AC
  Administered 2023-02-04: 30 mg via INTRAVENOUS
  Filled 2023-02-04: qty 1

## 2023-02-04 MED ORDER — SODIUM CHLORIDE 0.9 % IV BOLUS
1000.0000 mL | Freq: Once | INTRAVENOUS | Status: AC
  Administered 2023-02-04: 1000 mL via INTRAVENOUS

## 2023-02-04 MED ORDER — ROCURONIUM BROMIDE 10 MG/ML (PF) SYRINGE
PREFILLED_SYRINGE | INTRAVENOUS | Status: DC | PRN
  Administered 2023-02-04: 60 mg via INTRAVENOUS

## 2023-02-04 MED ORDER — FENTANYL CITRATE PF 50 MCG/ML IJ SOSY
PREFILLED_SYRINGE | INTRAMUSCULAR | Status: AC
  Filled 2023-02-04: qty 3

## 2023-02-04 MED ORDER — ACETAMINOPHEN 500 MG PO TABS
1000.0000 mg | ORAL_TABLET | Freq: Four times a day (QID) | ORAL | Status: DC
  Administered 2023-02-05 (×2): 1000 mg via ORAL
  Filled 2023-02-04 (×2): qty 2

## 2023-02-04 MED ORDER — ONDANSETRON HCL 4 MG/2ML IJ SOLN
INTRAMUSCULAR | Status: DC | PRN
  Administered 2023-02-04: 4 mg via INTRAVENOUS

## 2023-02-04 MED ORDER — FENTANYL CITRATE PF 50 MCG/ML IJ SOSY
25.0000 ug | PREFILLED_SYRINGE | INTRAMUSCULAR | Status: DC | PRN
  Administered 2023-02-04: 50 ug via INTRAVENOUS

## 2023-02-04 MED ORDER — LACTATED RINGERS IV SOLN: Freq: Once | INTRAVENOUS | Status: DC

## 2023-02-04 MED ORDER — METHOCARBAMOL 500 MG PO TABS
500.0000 mg | ORAL_TABLET | Freq: Three times a day (TID) | ORAL | Status: DC | PRN
  Administered 2023-02-04: 500 mg via ORAL
  Filled 2023-02-04: qty 1

## 2023-02-04 MED ORDER — SIMETHICONE 80 MG PO CHEW: 40.0000 mg | CHEWABLE_TABLET | Freq: Four times a day (QID) | ORAL | Status: DC | PRN

## 2023-02-04 MED ORDER — SERTRALINE HCL 50 MG PO TABS
50.0000 mg | ORAL_TABLET | Freq: Every day | ORAL | Status: DC
  Administered 2023-02-05: 50 mg via ORAL
  Filled 2023-02-04 (×2): qty 1

## 2023-02-04 MED ORDER — MORPHINE SULFATE (PF) 4 MG/ML IV SOLN
4.0000 mg | Freq: Once | INTRAVENOUS | Status: AC
  Administered 2023-02-04: 4 mg via INTRAVENOUS
  Filled 2023-02-04 (×3): qty 1

## 2023-02-04 MED ORDER — MEPERIDINE HCL 50 MG/ML IJ SOLN: 6.2500 mg | INTRAMUSCULAR | Status: DC | PRN

## 2023-02-04 MED ORDER — OXYCODONE HCL 5 MG PO TABS
5.0000 mg | ORAL_TABLET | ORAL | Status: DC | PRN
  Administered 2023-02-04 – 2023-02-05 (×2): 5 mg via ORAL
  Filled 2023-02-04 (×2): qty 1

## 2023-02-04 MED ORDER — MIDAZOLAM HCL 5 MG/5ML IJ SOLN
INTRAMUSCULAR | Status: DC | PRN
  Administered 2023-02-04: 2 mg via INTRAVENOUS

## 2023-02-04 MED ORDER — ONDANSETRON 4 MG PO TBDP: 4.0000 mg | ORAL_TABLET | Freq: Four times a day (QID) | ORAL | Status: DC | PRN

## 2023-02-04 MED ORDER — BUPIVACAINE-EPINEPHRINE 0.25% -1:200000 IJ SOLN
INTRAMUSCULAR | Status: DC | PRN
  Administered 2023-02-04: 7 mL

## 2023-02-04 MED ORDER — ALBUTEROL SULFATE (2.5 MG/3ML) 0.083% IN NEBU: 3.0000 mL | INHALATION_SOLUTION | Freq: Four times a day (QID) | RESPIRATORY_TRACT | Status: DC | PRN

## 2023-02-04 MED ORDER — FENTANYL CITRATE (PF) 100 MCG/2ML IJ SOLN
INTRAMUSCULAR | Status: DC | PRN
  Administered 2023-02-04: 50 ug via INTRAVENOUS
  Administered 2023-02-04: 100 ug via INTRAVENOUS

## 2023-02-04 MED ORDER — OXYCODONE HCL 5 MG PO TABS: 5.0000 mg | ORAL_TABLET | Freq: Once | ORAL | Status: DC | PRN

## 2023-02-04 MED ORDER — ONDANSETRON HCL 4 MG/2ML IJ SOLN: 4.0000 mg | Freq: Once | INTRAMUSCULAR | Status: DC | PRN

## 2023-02-04 MED ORDER — CHLORHEXIDINE GLUCONATE 0.12 % MT SOLN
15.0000 mL | Freq: Once | OROMUCOSAL | Status: AC
  Administered 2023-02-04: 15 mL via OROMUCOSAL
  Filled 2023-02-04: qty 15

## 2023-02-04 MED ORDER — PROPOFOL 10 MG/ML IV BOLUS
INTRAVENOUS | Status: DC | PRN
  Administered 2023-02-04: 160 mg via INTRAVENOUS

## 2023-02-04 MED ORDER — OXYCODONE HCL 5 MG/5ML PO SOLN: 5.0000 mg | Freq: Once | ORAL | Status: DC | PRN

## 2023-02-04 MED ORDER — SODIUM CHLORIDE 0.9 % IV SOLN: INTRAVENOUS | Status: DC

## 2023-02-04 MED ORDER — SODIUM CHLORIDE 0.9 % IR SOLN
Status: DC | PRN
  Administered 2023-02-04: 1000 mL

## 2023-02-04 MED ORDER — ONDANSETRON HCL 4 MG/2ML IJ SOLN: 4.0000 mg | Freq: Four times a day (QID) | INTRAMUSCULAR | Status: DC | PRN

## 2023-02-04 MED ORDER — ACETAMINOPHEN 325 MG PO TABS: 325.0000 mg | ORAL_TABLET | ORAL | Status: DC | PRN

## 2023-02-04 MED ORDER — MECLIZINE HCL 25 MG PO TABS: 25.0000 mg | ORAL_TABLET | Freq: Three times a day (TID) | ORAL | Status: DC | PRN

## 2023-02-04 MED ORDER — METRONIDAZOLE 500 MG/100ML IV SOLN
500.0000 mg | Freq: Once | INTRAVENOUS | Status: AC
  Administered 2023-02-04: 500 mg via INTRAVENOUS
  Filled 2023-02-04: qty 100

## 2023-02-04 MED ORDER — METHOCARBAMOL 1000 MG/10ML IJ SOLN: 500.0000 mg | Freq: Three times a day (TID) | INTRAVENOUS | Status: DC | PRN

## 2023-02-04 MED ORDER — SUGAMMADEX SODIUM 200 MG/2ML IV SOLN
INTRAVENOUS | Status: DC | PRN
  Administered 2023-02-04: 200 mg via INTRAVENOUS

## 2023-02-04 MED ORDER — DEXAMETHASONE SODIUM PHOSPHATE 10 MG/ML IJ SOLN
INTRAMUSCULAR | Status: DC | PRN
  Administered 2023-02-04: 10 mg via INTRAVENOUS

## 2023-02-04 MED ORDER — RINGERS IRRIGATION IR SOLN
Status: DC | PRN
  Administered 2023-02-04: 1000 mL

## 2023-02-04 MED ORDER — ENOXAPARIN SODIUM 40 MG/0.4ML IJ SOSY
40.0000 mg | PREFILLED_SYRINGE | INTRAMUSCULAR | Status: DC
  Administered 2023-02-05: 40 mg via SUBCUTANEOUS
  Filled 2023-02-04: qty 0.4

## 2023-02-04 MED ORDER — FAMOTIDINE 20 MG PO TABS: 20.0000 mg | ORAL_TABLET | Freq: Two times a day (BID) | ORAL | Status: DC | PRN

## 2023-02-04 SURGICAL SUPPLY — 59 items
ADH SKN CLS APL DERMABOND .7 (GAUZE/BANDAGES/DRESSINGS) ×1
APL SKNCLS STERI-STRIP NONHPOA (GAUZE/BANDAGES/DRESSINGS) ×2
APPLIER CLIP 5 13 M/L LIGAMAX5 (MISCELLANEOUS)
APPLIER CLIP ROT 10 11.4 M/L (STAPLE)
APR CLP MED LRG 11.4X10 (STAPLE)
APR CLP MED LRG 5 ANG JAW (MISCELLANEOUS)
BAG COUNTER SPONGE SURGICOUNT (BAG) IMPLANT
BAG SPEC RTRVL 10 TROC 200 (ENDOMECHANICALS) ×1
BAG SPNG CNTER NS LX DISP (BAG)
BENZOIN TINCTURE PRP APPL 2/3 (GAUZE/BANDAGES/DRESSINGS) ×2 IMPLANT
BNDG ADH 1X3 SHEER STRL LF (GAUZE/BANDAGES/DRESSINGS) ×5 IMPLANT
BNDG ADH THN 3X1 STRL LF (GAUZE/BANDAGES/DRESSINGS) ×5
CABLE HIGH FREQUENCY MONO STRZ (ELECTRODE) ×1 IMPLANT
CLIP APPLIE 5 13 M/L LIGAMAX5 (MISCELLANEOUS) IMPLANT
CLIP APPLIE ROT 10 11.4 M/L (STAPLE) IMPLANT
CLIP LIGATING HEMO LOK XL GOLD (MISCELLANEOUS) ×1 IMPLANT
CLSR STERI-STRIP ANTIMIC 1/2X4 (GAUZE/BANDAGES/DRESSINGS) IMPLANT
COVER SURGICAL LIGHT HANDLE (MISCELLANEOUS) ×1 IMPLANT
CUTTER FLEX LINEAR 45M (STAPLE) IMPLANT
DERMABOND ADVANCED .7 DNX12 (GAUZE/BANDAGES/DRESSINGS) IMPLANT
DRAIN CHANNEL 19F RND (DRAIN) IMPLANT
DRAPE LAPAROSCOPIC ABDOMINAL (DRAPES) ×1 IMPLANT
ELECT REM PT RETURN 15FT ADLT (MISCELLANEOUS) ×1 IMPLANT
ENDOLOOP SUT PDS II  0 18 (SUTURE)
ENDOLOOP SUT PDS II 0 18 (SUTURE) IMPLANT
EVACUATOR SILICONE 100CC (DRAIN) IMPLANT
GLOVE BIOGEL PI IND STRL 7.0 (GLOVE) ×1 IMPLANT
GLOVE SURG SS PI 7.0 STRL IVOR (GLOVE) ×1 IMPLANT
GOWN STRL REUS W/ TWL LRG LVL3 (GOWN DISPOSABLE) ×1 IMPLANT
GOWN STRL REUS W/ TWL XL LVL3 (GOWN DISPOSABLE) IMPLANT
GOWN STRL REUS W/TWL LRG LVL3 (GOWN DISPOSABLE) ×1
GOWN STRL REUS W/TWL XL LVL3 (GOWN DISPOSABLE)
GRASPER SUT TROCAR 14GX15 (MISCELLANEOUS) IMPLANT
IRRIG SUCT STRYKERFLOW 2 WTIP (MISCELLANEOUS) ×1
IRRIGATION SUCT STRKRFLW 2 WTP (MISCELLANEOUS) ×1 IMPLANT
KIT BASIN OR (CUSTOM PROCEDURE TRAY) ×1 IMPLANT
KIT TURNOVER KIT A (KITS) IMPLANT
PENCIL SMOKE EVACUATOR (MISCELLANEOUS) IMPLANT
POUCH RETRIEVAL ECOSAC 10 (ENDOMECHANICALS) ×1 IMPLANT
POUCH RETRIEVAL ECOSAC 10MM (ENDOMECHANICALS) ×1
RELOAD 45 VASCULAR/THIN (ENDOMECHANICALS) IMPLANT
RELOAD STAPLE 45 2.5 WHT GRN (ENDOMECHANICALS) IMPLANT
RELOAD STAPLE 45 3.5 BLU ETS (ENDOMECHANICALS) IMPLANT
RELOAD STAPLE TA45 3.5 REG BLU (ENDOMECHANICALS) ×1 IMPLANT
SCISSORS LAP 5X35 DISP (ENDOMECHANICALS) ×1 IMPLANT
SET TUBE SMOKE EVAC HIGH FLOW (TUBING) ×1 IMPLANT
SHEARS HARMONIC ACE PLUS 36CM (ENDOMECHANICALS) IMPLANT
SLEEVE Z-THREAD 5X100MM (TROCAR) ×1 IMPLANT
SPIKE FLUID TRANSFER (MISCELLANEOUS) ×1 IMPLANT
STRIP CLOSURE SKIN 1/2X4 (GAUZE/BANDAGES/DRESSINGS) ×1 IMPLANT
STRIP CLOSURE SKIN 1/4X4 (GAUZE/BANDAGES/DRESSINGS) ×1 IMPLANT
SUT ETHILON 2 0 PS N (SUTURE) IMPLANT
SUT MNCRL AB 4-0 PS2 18 (SUTURE) ×1 IMPLANT
TOWEL OR 17X26 10 PK STRL BLUE (TOWEL DISPOSABLE) ×1 IMPLANT
TOWEL OR NON WOVEN STRL DISP B (DISPOSABLE) ×1 IMPLANT
TRAY LAPAROSCOPIC (CUSTOM PROCEDURE TRAY) ×1 IMPLANT
TROCAR ADV FIXATION 12X100MM (TROCAR) ×1 IMPLANT
TROCAR XCEL NON-BLD 5MMX100MML (ENDOMECHANICALS) IMPLANT
TROCAR Z-THREAD OPTICAL 5X100M (TROCAR) ×1 IMPLANT

## 2023-02-04 NOTE — Anesthesia Procedure Notes (Signed)
Procedure Name: Intubation Date/Time: 02/04/2023 5:55 PM  Performed by: Kizzie Fantasia, CRNAPre-anesthesia Checklist: Patient identified, Emergency Drugs available, Suction available, Patient being monitored and Timeout performed Patient Re-evaluated:Patient Re-evaluated prior to induction Oxygen Delivery Method: Circle system utilized Preoxygenation: Pre-oxygenation with 100% oxygen Induction Type: IV induction and Rapid sequence Laryngoscope Size: Mac and 4 Grade View: Grade I Tube size: 7.0 mm Number of attempts: 1 Airway Equipment and Method: Stylet Placement Confirmation: ETT inserted through vocal cords under direct vision, positive ETCO2 and breath sounds checked- equal and bilateral Secured at: 21 cm Tube secured with: Tape Dental Injury: Teeth and Oropharynx as per pre-operative assessment

## 2023-02-04 NOTE — Anesthesia Preprocedure Evaluation (Addendum)
Anesthesia Evaluation  Patient identified by MRN, date of birth, ID band Patient awake    Reviewed: Allergy & Precautions, H&P , NPO status , Patient's Chart, lab work & pertinent test results  Airway Mallampati: I  TM Distance: >3 FB Neck ROM: Full    Dental no notable dental hx. (+) Teeth Intact, Dental Advisory Given   Pulmonary Current Smoker and Patient abstained from smoking.   Pulmonary exam normal breath sounds clear to auscultation       Cardiovascular Exercise Tolerance: Good negative cardio ROS Normal cardiovascular exam Rhythm:Regular Rate:Normal     Neuro/Psych  PSYCHIATRIC DISORDERS  Depression    negative neurological ROS  negative psych ROS   GI/Hepatic negative GI ROS, Neg liver ROS,,,  Endo/Other  negative endocrine ROS    Renal/GU negative Renal ROS  negative genitourinary   Musculoskeletal negative musculoskeletal ROS (+)    Abdominal   Peds negative pediatric ROS (+)  Hematology negative hematology ROS (+)   Anesthesia Other Findings   Reproductive/Obstetrics negative OB ROS                             Anesthesia Physical Anesthesia Plan  ASA: 2 and emergent  Anesthesia Plan: General   Post-op Pain Management: Toradol IV (intra-op)* and Ofirmev IV (intra-op)*   Induction: Intravenous and Cricoid pressure planned  PONV Risk Score and Plan: 2 and Dexamethasone and Treatment may vary due to age or medical condition  Airway Management Planned: Oral ETT  Additional Equipment: None  Intra-op Plan:   Post-operative Plan: Extubation in OR  Informed Consent: I have reviewed the patients History and Physical, chart, labs and discussed the procedure including the risks, benefits and alternatives for the proposed anesthesia with the patient or authorized representative who has indicated his/her understanding and acceptance.       Plan Discussed with:  Anesthesiologist and CRNA  Anesthesia Plan Comments: (  )       Anesthesia Quick Evaluation

## 2023-02-04 NOTE — Interval H&P Note (Signed)
History and Physical Interval Note:  02/04/2023 5:38 PM I have seen and examined patient, reviewed ct scan and labs. Discussed proceeding with lap appy tonight.  [name]  has presented today for surgery, with the diagnosis of ACUTE APPENDICITIS.  The various methods of treatment have been discussed with the patient and family. After consideration of risks, benefits and other options for treatment, the patient has consented to  Procedure(s): APPENDECTOMY LAPAROSCOPIC (N/A) as a surgical intervention.  The patient's history has been reviewed, patient examined, no change in status, stable for surgery.  I have reviewed the patient's chart and labs.  Questions were answered to the patient's satisfaction.     Jillian Kramer

## 2023-02-04 NOTE — ED Triage Notes (Signed)
C/O abdominal pain in the umbilical and right side area, sharp in nature. Started in nature, worst with movement. LBM yesterday,

## 2023-02-04 NOTE — ED Provider Notes (Signed)
[facility] Provider Note   CSN: [id number] Arrival date & time: 02/04/23  1111     History  Chief Complaint  Patient presents with   Abdominal Pain    Jillian Kramer is a 47 y.o. female with a past medical history of hyperlipidemia, cervical cancer presents today for evaluation of abdominal pain.  Patient reports that she started to have right lower quadrant abdominal pain since yesterday.  It is sharp, constant.  She denies any aggravating or alleviating factors.  Denies any fever, chest pain, shortness of breath, bowel change, urinary symptoms, rash.  He has not tried any medication at home for pain.  Abdominal Pain     Past Medical History:  Diagnosis Date   Barrett esophagus    Cancer (HCC) 2012   cervical stage 0   Depression    High cholesterol    Vertigo    Past Surgical History:  Procedure Laterality Date   ABDOMINAL HYSTERECTOMY       Home Medications Prior to Admission medications   Medication Sig Start Date End Date Taking? Authorizing Provider  famotidine (PEPCID) 20 MG tablet Take 1 tablet (20 mg total) by mouth 2 (two) times daily. 11/04/17   Bethel Born, PA-C  meclizine (ANTIVERT) 25 MG tablet Take 1 tablet (25 mg total) by mouth 3 (three) times daily as needed for dizziness. 01/23/18   Tilden Fossa, MD  methocarbamol (ROBAXIN) 500 MG tablet Take 1 tablet (500 mg total) by mouth 3 (three) times daily. 07/06/19   Cristina Gong, PA-C  ondansetron (ZOFRAN ODT) 4 MG disintegrating tablet Take 1 tablet (4 mg total) by mouth every 8 (eight) hours as needed for nausea or vomiting. 01/23/18   Tilden Fossa, MD  pantoprazole (PROTONIX) 20 MG tablet Take 1 tablet (20 mg total) by mouth daily. 03/31/18 05/30/18  Joy, Shawn C, PA-C  pravastatin (PRAVACHOL) 40 MG tablet Take 40 mg by mouth daily.    [provider]  sucralfate (CARAFATE) 1 GM/10ML suspension Take 10 mLs (1 g total) by mouth 4 (four)  times daily -  with meals and at bedtime. 03/31/18   Joy, Hillard Danker, PA-C      Allergies    Patient has no known allergies.    Review of Systems   Review of Systems  Gastrointestinal:  Positive for abdominal pain.    Physical Exam Updated Vital Signs BP (!) 130/93   Pulse 80   Temp 98.3 F (36.8 C) (Oral)   Resp 18   Ht 5\' 3"  (1.6 m)   Wt 86.2 kg   SpO2 97%   BMI 33.66 kg/m  Physical Exam Vitals and nursing note reviewed.  Constitutional:      Appearance: Normal appearance.  HENT:     Head: Normocephalic and atraumatic.     Mouth/Throat:     Mouth: Mucous membranes are moist.  Eyes:     General: No scleral icterus. Cardiovascular:     Rate and Rhythm: Normal rate and regular rhythm.     Pulses: Normal pulses.     Heart sounds: Normal heart sounds.  Pulmonary:     Effort: Pulmonary effort is normal.     Breath sounds: Normal breath sounds.  Abdominal:     General: Abdomen is flat.     Palpations: Abdomen is soft.     Tenderness: There is abdominal tenderness in the right lower quadrant.  Musculoskeletal:        General: No  deformity.  Skin:    General: Skin is warm.     Findings: No rash.  Neurological:     General: No focal deficit present.     Mental Status: She is alert.  Psychiatric:        Mood and Affect: Mood normal.     ED Results / Procedures / Treatments   Labs (all labs ordered are listed, but only abnormal results are displayed) Labs Reviewed  COMPREHENSIVE METABOLIC PANEL - Abnormal; Notable for the following components:      Result Value   Sodium 134 (*)    Glucose, Bld 103 (*)    Total Protein 8.3 (*)    All other components within normal limits  CBC - Abnormal; Notable for the following components:   RBC 5.27 (*)    MCV 70.8 (*)    MCH 24.1 (*)    All other components within normal limits  URINALYSIS, ROUTINE W REFLEX MICROSCOPIC - Abnormal; Notable for the following components:   Hgb urine dipstick TRACE (*)    All other  components within normal limits  URINALYSIS, MICROSCOPIC (REFLEX) - Abnormal; Notable for the following components:   Bacteria, UA MANY (*)    All other components within normal limits  LIPASE, BLOOD    EKG None  Radiology CT ABDOMEN PELVIS W CONTRAST  Result Date: 02/04/2023 CLINICAL DATA:  Right lower quadrant EXAM: CT ABDOMEN AND PELVIS WITH CONTRAST TECHNIQUE: Multidetector CT imaging of the abdomen and pelvis was performed using the standard protocol following bolus administration of intravenous contrast. RADIATION DOSE REDUCTION: This exam was performed according to the departmental dose-optimization program which includes automated exposure control, adjustment of the mA and/or kV according to patient size and/or use of iterative reconstruction technique. CONTRAST:  OMNIPAQUE IOHEXOL 300 MG/ML  SOLN COMPARISON:  08/11/2023 FINDINGS: Lower chest: No focal pulmonary opacity or pleural effusion. No pericardial effusion. Hepatobiliary: No focal hepatic lesion. The hepatic and portal veins are patent. No intra or extrahepatic biliary ductal dilatation. Gallbladder is unremarkable. Pancreas: Unremarkable. No pancreatic ductal dilatation or surrounding inflammatory changes. Spleen: Normal in size without focal abnormality. Adrenals/Urinary Tract: The adrenal glands are unremarkable. The kidneys enhance symmetrically with no hydronephrosis. Right renal cyst measures up to 4.7 cm, without enhancing components or septations; this has previously been characterized as a cyst and no follow-up is currently indicated. Additional smaller hypoenhancing focus in the left kidney is too small to categorize but most likely a renal cyst. The bladder is unremarkable. Stomach/Bowel: The appendix is hyperenhancing and enlarged, measuring up to 10 mm (series 2, image 57), with surrounding inflammatory fat stranding. No appendicolith is seen. No periappendiceal collection or free air. Stomach is within normal limits.  No evidence of bowel wall thickening or evidence of obstruction. Vascular/Lymphatic: The aorta is normal in caliber, and the origins of its branch vessels are patent. No enlarged abdominal or pelvic lymph nodes. Reproductive: Status post hysterectomy. No adnexal masses. Other: No free air or free fluid in the abdomen or pelvis. Musculoskeletal: No acute osseous abnormality. IMPRESSION: Acute uncomplicated appendicitis. Electronically Signed   By: Wiliam Ke M.D.   On: 02/04/2023 14:08    Procedures Procedures    Medications Ordered in ED Medications  cefTRIAXone (ROCEPHIN) 2 g in sodium chloride 0.9 % 100 mL IVPB (0 g Intravenous Stopped 02/04/23 1507)    And  metroNIDAZOLE (FLAGYL) IVPB 500 mg (500 mg Intravenous New Bag/Given 02/04/23 1600)  chlorhexidine (PERIDEX) 0.12 % solution 15 mL (has no  administration in time range)  morphine (PF) 4 MG/ML injection 4 mg (4 mg Intravenous Given 02/04/23 1614)  iohexol (OMNIPAQUE) 300 MG/ML solution 100 mL (100 mLs Intravenous Contrast Given 02/04/23 1340)  ketorolac (TORADOL) 30 MG/ML injection 30 mg (30 mg Intravenous Given 02/04/23 1407)  sodium chloride 0.9 % bolus 1,000 mL (1,000 mLs Intravenous New Bag/Given 02/04/23 1440)    ED Course/ Medical Decision Making/ A&P                             Medical Decision Making Amount and/or Complexity of Data Reviewed Labs: ordered. Radiology: ordered.  Risk Prescription drug management.   This patient presents to the ED for abdominal pain, this involves an extensive number of treatment options, and is a complaint that carries with a high risk of complications and morbidity.  The differential diagnosis includes diverticulitis, hernia, diverticulitis, UTI, constipation; ectopic pregnancy, ovarian torsion, ovarian cyst, PID/TOA, period/fibroid.  This is not an exhaustive list.  Lab tests: I ordered and personally interpreted labs.  The pertinent results include: WBC unremarkable. Hbg unremarkable.  Platelets unremarkable. Electrolytes unremarkable. BUN, creatinine unremarkable.  Urinalysis with bacteruria.  Imaging studies: I ordered imaging studies. I personally reviewed, interpreted imaging and agree with the radiologist's interpretations. The results include: CT scan show acute uncomplicated appendicitis.  Problem list/ ED course/ Critical interventions/ Medical management: HPI: See above Vital signs within normal range and stable throughout visit. Laboratory/imaging studies significant for: See above. On physical examination, patient is afebrile and appears in no acute distress.  CBC with no evidence of leukocytosis or anemia.  CMP with no acute electrolyte abnormalities.  There was tenderness to palpation to the right lower quadrant on my examination.  CT scan showed acute uncomplicated appendicitis.  Given IV fluid and antibiotics.  Will consult general surgery.  Patient is alert and awake.  She is hemodynamically stable at this point.  Plan discussed with patient and she is agreeable to the plan. I have reviewed the patient home medicines and have made adjustments as needed.  Cardiac monitoring/EKG: The patient was maintained on a cardiac monitor.  I personally reviewed and interpreted the cardiac monitor which showed an underlying rhythm of: sinus rhythm.  Additional history obtained: External records from outside source obtained and reviewed including: Chart review including previous notes, labs, imaging.  Consultations obtained: I requested consultation with Dr. Sheliah Hatch general surgery, and discussed lab and imaging findings as well as pertinent plan.  He/she recommended transfer to preop at [facility] for surgery..  Disposition Admitted. This chart was dictated using voice recognition software.  Despite best efforts to proofread,  errors can occur which can change the documentation meaning.          Final Clinical Impression(s) / ED Diagnoses Final diagnoses:   Diverticulitis    Rx / DC Orders ED Discharge Orders     None         Jeanelle Malling, [place] 02/04/23 1621    Tegeler, Canary Brim, MD 02/05/23 1524

## 2023-02-04 NOTE — ED Notes (Signed)
[facility] on the floor, paperwork given, pt belongings in bags/labeled & given to [facility]

## 2023-02-04 NOTE — ED Notes (Signed)
Report rec'd from prev RN 

## 2023-02-04 NOTE — Discharge Instructions (Signed)
[facility] LAPAROSCOPIC SURGERY: POST OP INSTRUCTIONS  Always review your discharge instruction sheet given to you by the facility where your surgery was performed. IF YOU HAVE DISABILITY OR FAMILY LEAVE FORMS, YOU MUST BRING THEM TO THE OFFICE FOR PROCESSING.   DO NOT GIVE THEM TO YOUR DOCTOR.  A prescription for pain medication may be given to you upon discharge.  Take your pain medication as prescribed, if needed.  If narcotic pain medicine is not needed, then you may take acetaminophen (Tylenol), naprosyn (Alleve), or ibuprofen (Advil) as needed. Take your usually prescribed medications unless otherwise directed. If you need a refill on your pain medication, please contact your pharmacy.  They will contact our office to request authorization. Prescriptions will not be filled after 5pm or on week-ends. You should follow a light diet the first few days after arrival home, such as soup and crackers, etc.  Be sure to include lots of fluids daily. Most patients will experience some swelling and bruising in the area of the incisions.  Ice packs will help.  Swelling and bruising can take several days to resolve.  It is common to experience some constipation if taking pain medication after surgery.  Increasing fluid intake and taking a stool softener (such as Colace) will usually help or prevent this problem from occurring.  A mild laxative (Milk of Magnesia or Miralax) should be taken according to package instructions if there are no bowel movements after 48 hours. Unless discharge instructions indicate otherwise, you may remove your bandages 48 hours after surgery, and you may shower at that time.  You may have steri-strips (small skin tapes) in place directly over the incision.  These strips should be left on the skin for 7-10 days.  If your surgeon used skin glue on the incision, you may shower in 24 hours.  The glue will flake off over the next  2-3 weeks.  Any sutures or staples will be removed at the office during your follow-up visit. ACTIVITIES:  You may resume regular (light) daily activities beginning the next day--such as daily self-care, walking, climbing stairs--gradually increasing activities as tolerated.  You may have sexual intercourse when it is comfortable.  Refrain from any heavy lifting or straining until approved by your doctor. You may drive when you are no longer taking prescription pain medication, you can comfortably wear a seatbelt, and you can safely maneuver your car and apply brakes. RETURN TO WORK:  __________________________________________________________ You should see your doctor in the office for a follow-up appointment approximately 2-3 weeks after your surgery.  Make sure that you call for this appointment within a day or two after you arrive home to insure a convenient appointment time. OTHER INSTRUCTIONS: __________________________________________________________________________________________________________________________ __________________________________________________________________________________________________________________________ WHEN TO CALL YOUR DOCTOR: Fever over 101.0 Inability to urinate Continued bleeding from incision. Increased pain, redness, or drainage from the incision. Increasing abdominal pain  The clinic staff is available to answer your questions during regular business hours.  Please don't hesitate to call and ask to speak to one of the nurses for clinical concerns.  If you have a medical emergency, go to the nearest emergency room or call 911.  A surgeon from [facility] is always on call at the hospital. [address], [address], [place], [place]  [address] ? [address] [address], [place],    [address] [phone] ? [phone] ? FAX [phone] Web site: [email or web address]  

## 2023-02-04 NOTE — Op Note (Signed)
Preoperative diagnosis: Acute appendicitis Postoperative diagnosis: Same as above Procedure: Laparoscopic appendectomy Surgeon: Dr. Harden Mo Anesthesia: General Estimated blood loss: Minimal Complications: None Drains: None Specimens: Appendix to pathology Sponge needle count was correct completion Disposition recovery stable  Indications: This is a 46-year female with 1 day of abdominal pain of right lower quadrant pain.  She has some nausea.  She was evaluated at a emergency room locally and was found to have a CT scan consistent with acute appendicitis.  I saw her and agreed with this clinically.  We discussed proceeding with laparoscopic appendectomy.  Procedure: After informed consent was obtained she was taken to the operating room.  She had already been given antibiotics.  SCDs were placed.  She had just voided so we did not place a Foley.  She was placed under general esthesia without complication.  She was prepped and draped in standard sterile surgical fashion.  Surgical timeout was then performed.  I have treated Marcaine and left upper quadrant.  She had a prior hysterectomy as well as a tubal so I wanted to do direct optical entry.  I then used a 5 mm Ethicon direct optical entry trocar to enter into the peritoneal cavity without injury.  The abdomen was then insufflated to 15 mmHg pressure.  I then placed an additional 5 mm trocar in the left lower quadrant.  She had adhesions to her umbilical scar as well as in the midline.  I lysed these with blunt dissection.  I was then able to place a 12 mm trocar in the left mid abdomen.  I did not want to place this through her prior scar given the propensity for a hernia.  I was unable to identify the appendix.  She clearly had acute appendicitis.  This was nonperforated.  The distal one third was very enlarged.  This was wrapped on top of the cecum.  I was able to identify the base easily.  This was healthy.  I then took down the white  line so I could rotate this medially.  I then was able to come around the base with the [place].  I used the stapler to divide this on the cecum.  This was hemostatic at completion.  I then used the harmonic scalpel to divide the appendiceal mesentery and remove this.  I placed an retrieval bag and removed from the abdomen.  I did look and hemostasis was observed.  The staple line terminal ileum and cecum were all without injury.  I then removed the 12 mm trocar.  I placed a 0 Vicryl suture using the suture passer device to completely better at that defect.  The abdomen was then desufflated the remaining trocars were removed.  I closed these with 4 Monocryl and glue.  She tolerated as well as extubated transferred to recovery stable.

## 2023-02-04 NOTE — H&P (Signed)
Reason for Consult:abdominal pain Referring Provider: [name] is an 47 y.o. female.  HPI: 47 yo female with 1 day of abdominal pain. Pain is constant and has gotten worse throughout the day. She has nausea but no vomiting or fevers  Past Medical History:  Diagnosis Date   Barrett esophagus    Cancer (HCC) 2012   cervical stage 0   Depression    High cholesterol    Vertigo     Past Surgical History:  Procedure Laterality Date   ABDOMINAL HYSTERECTOMY      History reviewed. No pertinent family history.  Social History:  reports that she has been smoking cigarettes. She has been smoking an average of .5 packs per day. She has never used smokeless tobacco. She reports current alcohol use. She reports that she does not use drugs.  Allergies: No Known Allergies  Medications: I have reviewed the patient's current medications.  Results for orders placed or performed during the hospital encounter of 02/04/23 (from the past 48 hour(s))  Lipase, blood     Status: None   Collection Time: 02/04/23 11:36 AM  Result Value Ref Range   Lipase 27 11 - 51 U/L    Comment: Performed at [facility], [address] [place], [place] [address]  Comprehensive metabolic panel     Status: Abnormal   Collection Time: 02/04/23 11:36 AM  Result Value Ref Range   Sodium 134 (L) 135 - 145 mmol/L   Potassium 3.8 3.5 - 5.1 mmol/L   Chloride 103 98 - 111 mmol/L   CO2 23 22 - 32 mmol/L   Glucose, Bld 103 (H) 70 - 99 mg/dL    Comment: Glucose reference range applies only to samples taken after fasting for at least 8 hours.   BUN 11 6 - 20 mg/dL   Creatinine, Ser 6.04 0.44 - 1.00 mg/dL   Calcium 9.5 8.9 - 54.0 mg/dL   Total Protein 8.3 (H) 6.5 - 8.1 g/dL   Albumin 4.2 3.5 - 5.0 g/dL   AST 22 15 - 41 U/L   ALT 22 0 - 44 U/L   Alkaline Phosphatase 69 38 - 126 U/L   Total Bilirubin 0.6 0.3 - 1.2 mg/dL   GFR, Estimated >98 >11 mL/min    Comment: (NOTE) Calculated using  the CKD-EPI Creatinine Equation (2021)    Anion gap 8 5 - 15    Comment: Performed at [facility], [address] [place], [place] [address]  CBC     Status: Abnormal   Collection Time: 02/04/23 11:36 AM  Result Value Ref Range   WBC 9.5 4.0 - 10.5 K/uL   RBC 5.27 (H) 3.87 - 5.11 MIL/uL   Hemoglobin 12.7 12.0 - 15.0 g/dL   HCT 29.5 62.1 - 30.8 %   MCV 70.8 (L) 80.0 - 100.0 fL   MCH 24.1 (L) 26.0 - 34.0 pg   MCHC 34.0 30.0 - 36.0 g/dL   RDW 65.7 84.6 - 96.2 %   Platelets 332 150 - 400 K/uL   nRBC 0.0 0.0 - 0.2 %    Comment: Performed at [facility], [address] [place], [place] [address]  Urinalysis, Routine w reflex microscopic -Urine, Clean Catch     Status: Abnormal   Collection Time: 02/04/23 12:10 PM  Result Value Ref Range   Color, Urine YELLOW YELLOW   APPearance CLEAR CLEAR   Specific Gravity, Urine 1.010 1.005 -  1.030   pH 5.5 5.0 - 8.0   Glucose, UA NEGATIVE NEGATIVE mg/dL   Hgb urine dipstick TRACE (A) NEGATIVE   Bilirubin Urine NEGATIVE NEGATIVE   Ketones, ur NEGATIVE NEGATIVE mg/dL   Protein, ur NEGATIVE NEGATIVE mg/dL   Nitrite NEGATIVE NEGATIVE   Leukocytes,Ua NEGATIVE NEGATIVE    Comment: Performed at [facility], [address] [place], [place] [address]  Urinalysis, Microscopic (reflex)     Status: Abnormal   Collection Time: 02/04/23 12:10 PM  Result Value Ref Range   RBC / HPF 0-5 0 - 5 RBC/hpf   WBC, UA 0-5 0 - 5 WBC/hpf   Bacteria, UA MANY (A) NONE SEEN   Squamous Epithelial / HPF 0-5 0 - 5 /HPF    Comment: Performed at [facility], [address] [place], [place] [address]    CT ABDOMEN PELVIS W CONTRAST  Result Date: 02/04/2023 CLINICAL DATA:  Right lower quadrant EXAM: CT ABDOMEN AND PELVIS WITH CONTRAST TECHNIQUE: Multidetector CT imaging of the abdomen and pelvis was performed using the standard protocol following bolus administration of intravenous contrast. RADIATION DOSE REDUCTION: This  exam was performed according to the departmental dose-optimization program which includes automated exposure control, adjustment of the mA and/or kV according to patient size and/or use of iterative reconstruction technique. CONTRAST:  OMNIPAQUE IOHEXOL 300 MG/ML  SOLN COMPARISON:  08/11/2023 FINDINGS: Lower chest: No focal pulmonary opacity or pleural effusion. No pericardial effusion. Hepatobiliary: No focal hepatic lesion. The hepatic and portal veins are patent. No intra or extrahepatic biliary ductal dilatation. Gallbladder is unremarkable. Pancreas: Unremarkable. No pancreatic ductal dilatation or surrounding inflammatory changes. Spleen: Normal in size without focal abnormality. Adrenals/Urinary Tract: The adrenal glands are unremarkable. The kidneys enhance symmetrically with no hydronephrosis. Right renal cyst measures up to 4.7 cm, without enhancing components or septations; this has previously been characterized as a cyst and no follow-up is currently indicated. Additional smaller hypoenhancing focus in the left kidney is too small to categorize but most likely a renal cyst. The bladder is unremarkable. Stomach/Bowel: The appendix is hyperenhancing and enlarged, measuring up to 10 mm (series 2, image 57), with surrounding inflammatory fat stranding. No appendicolith is seen. No periappendiceal collection or free air. Stomach is within normal limits. No evidence of bowel wall thickening or evidence of obstruction. Vascular/Lymphatic: The aorta is normal in caliber, and the origins of its branch vessels are patent. No enlarged abdominal or pelvic lymph nodes. Reproductive: Status post hysterectomy. No adnexal masses. Other: No free air or free fluid in the abdomen or pelvis. Musculoskeletal: No acute osseous abnormality. IMPRESSION: Acute uncomplicated appendicitis. Electronically Signed   By: Wiliam Ke M.D.   On: 02/04/2023 14:08    Review of Systems  Constitutional: Negative.   HENT:  Negative.    Eyes: Negative.   Respiratory: Negative.    Cardiovascular: Negative.   Gastrointestinal:  Positive for abdominal pain and nausea.  Genitourinary: Negative.   Musculoskeletal: Negative.   Skin: Negative.   Neurological: Negative.   Endo/Heme/Allergies: Negative.   Psychiatric/Behavioral: Negative.      PE Blood pressure (!) 146/99, pulse 85, temperature 98.1 F (36.7 C), temperature source Oral, resp. rate 18, height 5\' 3"  (1.6 m), weight 86.2 kg, SpO2 96 %. Constitutional: NAD; conversant; no deformities Eyes: Moist conjunctiva; no lid lag; anicteric; PERRL Neck: Trachea midline; no thyromegaly Lungs: Normal respiratory effort; no tactile fremitus CV: RRR; no palpable thrills; no pitting edema GI: Abd soft,  TTP RLQ; no palpable hepatosplenomegaly MSK: Normal gait; no clubbing/cyanosis Psychiatric: Appropriate affect; alert and oriented x3 Lymphatic: No palpable cervical or axillary lymphadenopathy Skin: No major subcutaneous nodules. Warm and dry   Assessment/Plan: 47 yo female with acute appendicitis -IV abx -OR for lap appendectomy -we discussed the details of the procedure; that it would be done under general anesthesia, that we would attempt to do the procedure laparoscopically. That the appendix would be isolated from the large and small intestine and then ligated and removed. We discussed the reason for this is to avoid rupture and infection and resolve the pains. We discussed risks of infection, abscess, injury to intestines or urinary structures, and need for open incision. She showed good understanding and wanted to proceed.   I reviewed last 24 h vitals and pain scores, last 48 h intake and output, last 24 h labs and trends, and last 24 h imaging results.  This care required high  level of medical decision making.   De Blanch Moroni Nester 02/04/2023, 5:16 PM

## 2023-02-04 NOTE — Transfer of Care (Signed)
Immediate Anesthesia Transfer of Care Note  Patient: Jillian Kramer  Procedure(s) Performed: APPENDECTOMY LAPAROSCOPIC (Abdomen)  Patient Location: PACU  Anesthesia Type:General  Level of Consciousness: awake, alert , and oriented  Airway & Oxygen Therapy: Patient Spontanous Breathing and Patient connected to face mask oxygen  Post-op Assessment: Report given to RN and Post -op Vital signs reviewed and stable  Post vital signs: Reviewed and stable  Last Vitals:  Vitals Value Taken Time  BP 171/100 02/04/23 1849  Temp    Pulse 87 02/04/23 1851  Resp 19 02/04/23 1851  SpO2 100 % 02/04/23 1851  Vitals shown include unvalidated device data.  Last Pain:  Vitals:   02/04/23 1709  TempSrc: Oral  PainSc:          Complications: No notable events documented.

## 2023-02-05 ENCOUNTER — Encounter (HOSPITAL_COMMUNITY): Payer: Self-pay | Admitting: General Surgery

## 2023-02-05 MED ORDER — OXYCODONE HCL 5 MG PO TABS: 5.0000 mg | ORAL_TABLET | ORAL | 0 refills | Status: AC | PRN

## 2023-02-05 NOTE — Plan of Care (Signed)

## 2023-02-05 NOTE — Progress Notes (Signed)
Reviewed written d/c instructions w pt and all questions answered. She verbalized understanding. D/C via w/c w all belongings in stable condition. 

## 2023-02-05 NOTE — Discharge Summary (Signed)
Physician Discharge Summary  Patient ID: Jillian Kramer MRN: [id number] DOB/AGE: [birth year] 48 y.o.  Admit date: 02/04/2023 Discharge date: 02/05/2023  Admission Diagnoses: Appendicitis  Discharge Diagnoses:  Principal Problem:   Appendicitis   Discharged Condition: good  Hospital Course: 47 yof with appendicitis.  Underwent uneventful lap appy for acute nonperforated appendicitis. She is doing well following am and ready for dc. She will need a colonoscopy referral at postop visit  Consults: None  Significant Diagnostic Studies: radiology: CT scan: appendicitis  Treatments: surgery: lap appy  Discharge Exam: Blood pressure 124/84, pulse 95, temperature 97.8 F (36.6 C), resp. rate 16, height 5\' 3"  (1.6 m), weight 86.2 kg, SpO2 92 %. Ab soft approp tender incisions clean   Disposition: Discharge disposition: 01-Home or Self Care        Allergies as of 02/05/2023   No Known Allergies      Medication List     TAKE these medications    Acetaminophen 500 MG capsule Take 500-1,000 mg by mouth every 6 (six) hours as needed for pain (or headaches).   Excedrin Migraine 250-250-65 MG tablet Generic drug: aspirin-acetaminophen-caffeine Take 2 tablets by mouth every 6 (six) hours as needed for headache or migraine.   famotidine 20 MG tablet Commonly known as: PEPCID Take 1 tablet (20 mg total) by mouth 2 (two) times daily. What changed:  when to take this reasons to take this   meclizine 25 MG tablet Commonly known as: ANTIVERT Take 1 tablet (25 mg total) by mouth 3 (three) times daily as needed for dizziness.   methocarbamol 500 MG tablet Commonly known as: ROBAXIN Take 1 tablet (500 mg total) by mouth 3 (three) times daily.   ondansetron 4 MG disintegrating tablet Commonly known as: Zofran ODT Take 1 tablet (4 mg total) by mouth every 8 (eight) hours as needed for nausea or vomiting.   oxyCODONE 5 MG immediate release tablet Commonly known as: Oxy  IR/ROXICODONE Take 1 tablet (5 mg total) by mouth every 4 (four) hours as needed for moderate pain.   pantoprazole 20 MG tablet Commonly known as: PROTONIX Take 1 tablet (20 mg total) by mouth daily.   pravastatin 40 MG tablet Commonly known as: PRAVACHOL Take 40 mg by mouth daily.   sertraline 50 MG tablet Commonly known as: ZOLOFT Take 50 mg by mouth daily.   sucralfate 1 GM/10ML suspension Commonly known as: Carafate Take 10 mLs (1 g total) by mouth 4 (four) times daily -  with meals and at bedtime.   Ventolin HFA 108 (90 Base) MCG/ACT inhaler Generic drug: albuterol Inhale 1-2 puffs into the lungs every 6 (six) hours as needed for wheezing or shortness of breath.   Vitamin D3 50 MCG Caps Take 2,000 Units by mouth daily.        Follow-up Information     [facility] Follow up in 2 week(s).   Specialty: General Surgery Contact information: [address] [place] [address] [phone]                Signed: Emelia Loron 02/05/2023, 8:54 AM

## 2023-02-06 NOTE — Anesthesia Postprocedure Evaluation (Signed)
Anesthesia Post Note  Patient: Jillian Kramer  Procedure(s) Performed: APPENDECTOMY LAPAROSCOPIC (Abdomen)     Patient location during evaluation: PACU Anesthesia Type: General Level of consciousness: awake and alert Pain management: pain level controlled Vital Signs Assessment: post-procedure vital signs reviewed and stable Respiratory status: spontaneous breathing, nonlabored ventilation, respiratory function stable and patient connected to nasal cannula oxygen Cardiovascular status: blood pressure returned to baseline and stable Postop Assessment: no apparent nausea or vomiting Anesthetic complications: no   No notable events documented.  Last Vitals:  Vitals:   02/05/23 0530 02/05/23 1012  BP: 124/84 (!) 120/97  Pulse: 95 89  Resp: 16 18  Temp: 36.6 C 36.8 C  SpO2: 92% 97%    Last Pain:  Vitals:   02/05/23 0800  TempSrc:   PainSc: Asleep                 Brunetta Newingham

## 2023-02-08 LAB — SURGICAL PATHOLOGY
